# Patient Record
Sex: Female | Born: 1960 | Race: Black or African American | Hispanic: No | Marital: Married | Smoking: Current some day smoker
Health system: Southern US, Community
[De-identification: ages and names within clinical notes are randomized; demographics above are authoritative.]

## PROBLEM LIST (undated history)

## (undated) ENCOUNTER — Ambulatory Visit: Payer: BLUE CROSS/BLUE SHIELD | Source: Home / Self Care

## (undated) DIAGNOSIS — K219 Gastro-esophageal reflux disease without esophagitis: Secondary | ICD-10-CM

## (undated) DIAGNOSIS — F1910 Other psychoactive substance abuse, uncomplicated: Secondary | ICD-10-CM

## (undated) DIAGNOSIS — F419 Anxiety disorder, unspecified: Secondary | ICD-10-CM

## (undated) DIAGNOSIS — F32A Depression, unspecified: Secondary | ICD-10-CM

## (undated) DIAGNOSIS — D649 Anemia, unspecified: Secondary | ICD-10-CM

## (undated) DIAGNOSIS — G4700 Insomnia, unspecified: Secondary | ICD-10-CM

## (undated) DIAGNOSIS — I10 Essential (primary) hypertension: Secondary | ICD-10-CM

## (undated) DIAGNOSIS — F329 Major depressive disorder, single episode, unspecified: Secondary | ICD-10-CM

## (undated) DIAGNOSIS — E785 Hyperlipidemia, unspecified: Secondary | ICD-10-CM

## (undated) HISTORY — DX: Anemia, unspecified: D64.9

## (undated) HISTORY — DX: Essential (primary) hypertension: I10

## (undated) HISTORY — DX: Other psychoactive substance abuse, uncomplicated: F19.10

## (undated) HISTORY — DX: Hyperlipidemia, unspecified: E78.5

## (undated) HISTORY — DX: Gastro-esophageal reflux disease without esophagitis: K21.9

## (undated) HISTORY — DX: Insomnia, unspecified: G47.00

## (undated) HISTORY — DX: Major depressive disorder, single episode, unspecified: F32.9

## (undated) HISTORY — PX: TUBAL LIGATION: SHX77

## (undated) HISTORY — DX: Anxiety disorder, unspecified: F41.9

## (undated) HISTORY — DX: Depression, unspecified: F32.A

---

## 2010-12-23 HISTORY — PX: COLONOSCOPY: SHX174

## 2011-08-09 ENCOUNTER — Encounter: Payer: Self-pay | Admitting: Gastroenterology

## 2011-08-16 ENCOUNTER — Ambulatory Visit (AMBULATORY_SURGERY_CENTER): Payer: Managed Care, Other (non HMO) | Admitting: *Deleted

## 2011-08-16 ENCOUNTER — Encounter: Payer: Self-pay | Admitting: Gastroenterology

## 2011-08-16 VITALS — Ht 65.0 in | Wt 156.0 lb

## 2011-08-16 DIAGNOSIS — Z1211 Encounter for screening for malignant neoplasm of colon: Secondary | ICD-10-CM

## 2011-08-16 MED ORDER — SUPREP BOWEL PREP KIT 17.5-3.13-1.6 GM/177ML PO SOLN: 1.0000 | ORAL | Status: DC

## 2011-08-23 ENCOUNTER — Other Ambulatory Visit: Payer: Self-pay | Admitting: Gastroenterology

## 2011-09-10 ENCOUNTER — Encounter: Payer: Self-pay | Admitting: Gastroenterology

## 2011-09-10 ENCOUNTER — Ambulatory Visit (AMBULATORY_SURGERY_CENTER): Payer: Managed Care, Other (non HMO) | Admitting: Gastroenterology

## 2011-09-10 VITALS — BP 133/76 | HR 71 | Temp 97.7°F | Resp 22 | Ht 65.0 in | Wt 156.0 lb

## 2011-09-10 DIAGNOSIS — R109 Unspecified abdominal pain: Secondary | ICD-10-CM

## 2011-09-10 DIAGNOSIS — Z1211 Encounter for screening for malignant neoplasm of colon: Secondary | ICD-10-CM

## 2011-09-10 DIAGNOSIS — D126 Benign neoplasm of colon, unspecified: Secondary | ICD-10-CM

## 2011-09-10 DIAGNOSIS — D509 Iron deficiency anemia, unspecified: Secondary | ICD-10-CM

## 2011-09-10 MED ORDER — SODIUM CHLORIDE 0.9 % IV SOLN: 500.0000 mL | INTRAVENOUS | Status: DC

## 2011-09-10 NOTE — Progress Notes (Signed)
Pt tolerated the colon exam very well. Maw  Pt was sedate with propofol bu Brennan Bailey, CRNA.

## 2011-09-11 ENCOUNTER — Telehealth: Payer: Self-pay | Admitting: *Deleted

## 2011-09-11 NOTE — Telephone Encounter (Signed)
No answer, no identifier, no message left.  

## 2011-09-16 ENCOUNTER — Encounter: Payer: Self-pay | Admitting: Gastroenterology

## 2012-04-06 ENCOUNTER — Ambulatory Visit: Payer: Managed Care, Other (non HMO) | Admitting: Gynecology

## 2012-04-09 ENCOUNTER — Encounter: Payer: Self-pay | Admitting: Gynecology

## 2012-04-09 ENCOUNTER — Ambulatory Visit (INDEPENDENT_AMBULATORY_CARE_PROVIDER_SITE_OTHER): Payer: Managed Care, Other (non HMO) | Admitting: Gynecology

## 2012-04-09 ENCOUNTER — Ambulatory Visit: Payer: Managed Care, Other (non HMO) | Admitting: Gynecology

## 2012-04-09 VITALS — BP 114/74 | Ht 65.0 in | Wt 157.0 lb

## 2012-04-09 DIAGNOSIS — N644 Mastodynia: Secondary | ICD-10-CM

## 2012-04-09 DIAGNOSIS — G4700 Insomnia, unspecified: Secondary | ICD-10-CM | POA: Insufficient documentation

## 2012-04-09 DIAGNOSIS — D649 Anemia, unspecified: Secondary | ICD-10-CM

## 2012-04-09 DIAGNOSIS — F1010 Alcohol abuse, uncomplicated: Secondary | ICD-10-CM

## 2012-04-09 DIAGNOSIS — N939 Abnormal uterine and vaginal bleeding, unspecified: Secondary | ICD-10-CM

## 2012-04-09 DIAGNOSIS — F17200 Nicotine dependence, unspecified, uncomplicated: Secondary | ICD-10-CM | POA: Insufficient documentation

## 2012-04-09 DIAGNOSIS — N938 Other specified abnormal uterine and vaginal bleeding: Secondary | ICD-10-CM | POA: Insufficient documentation

## 2012-04-09 DIAGNOSIS — N924 Excessive bleeding in the premenopausal period: Secondary | ICD-10-CM

## 2012-04-09 DIAGNOSIS — I10 Essential (primary) hypertension: Secondary | ICD-10-CM | POA: Insufficient documentation

## 2012-04-09 MED ORDER — IRON 325 (65 FE) MG PO TABS: 325.0000 mg | ORAL_TABLET | Freq: Two times a day (BID) | ORAL | Status: DC

## 2012-04-09 MED ORDER — MEGESTROL ACETATE 40 MG PO TABS: 40.0000 mg | ORAL_TABLET | Freq: Two times a day (BID) | ORAL | Status: AC

## 2012-04-09 NOTE — Patient Instructions (Signed)
Transvaginal Ultrasound Transvaginal ultrasound is a pelvic ultrasound, using a metal probe that is placed in the vagina, to look at a women's female organs. Transvaginal ultrasound is a method of seeing inside the pelvis of a woman. The ultrasound machine sends out sound waves from the transducer (probe). These sound waves bounce off body structures (like an echo) to create a picture. The picture shows up on a monitor. It is called transvaginal because the probe is inserted into the vagina. There should be very little discomfort from the vaginal probe. This test can also be used during pregnancy. Endovaginal ultrasound is another name for a transvaginal ultrasound. In a transabdominal ultrasound, the probe is placed on the outside of the belly. This method gives pictures that are lower quality than pictures from the transvaginal technique. Transvaginal ultrasound is used to look for problems of the female genital tract. Some such problems include:  Infertility problems.   Congenital (birth defect) malformations of the uterus and ovaries.   Tumors in the uterus.   Abnormal bleeding.   Ovarian tumors and cysts.   Abscess (inflamed tissue around pus) in the pelvis.   Unexplained abdominal or pelvic pain.   Pelvic infection.  DURING PREGNANCY, TRANSVAGINAL ULTRASOUND MAY BE USED TO LOOK AT:  Normal pregnancy.   Ectopic pregnancy (pregnancy outside the uterus).   Fetal heartbeat.   Abnormalities in the pelvis, that are not seen well with transabdominal ultrasound.   Suspected twins or multiples.   Impending miscarriage.   Problems with the cervix (incompetent cervix, not able to stay closed and hold the baby).   When doing an amniocentesis (removing fluid from the pregnancy sac, for testing).   Looking for abnormalities of the baby.   Checking the growth, development, and age of the fetus.   Measuring the amount of fluid in the amniotic sac.   When doing an external version of  the baby (moving baby into correct position).   Evaluating the baby for problems in high risk pregnancies (biophysical profile).   Suspected fetal demise (death).  Sometimes a special ultrasound method called Saline Infusion Sonography (SIS) is used for a more accurate look at the uterus. Sterile saline (salt water) is injected into the uterus of non-pregnant patients to see the inside of the uterus better. SIS is not used on pregnant women. The vaginal probe can also assist in obtaining biopsies of abnormal areas, in draining fluid from cysts on the ovary, and in finding IUDs (intrauterine device, birth control) that cannot be located. PREPARATION FOR TEST A transvaginal ultrasound is done with the bladder empty. The transabdominal ultrasound is done with your bladder full. You may be asked to drink several glasses of water before that exam. Sometimes, a transabdominal ultrasound is done just after a transvaginal ultrasound, to look at organs in your abdomen. PROCEDURE  You will lie down on a table, with your knees bent and your feet in foot holders. The probe is covered with a condom. A sterile lubricant is put into the vagina and on the probe. The lubricant helps transmit the sound waves and avoid irritating the vagina. Your caregiver will move the probe inside the vaginal cavity to scan the pelvic structures. A normal test will show a normal pelvis and normal contents. An abnormal test will show abnormalities of the pelvis, placenta, or baby. ABNORMAL RESULTS MAY BE DUE TO:  Growths or tumors in the:   Uterus.   Ovaries.   Vagina.   Other pelvic structures.   Non-cancerous   growths of the uterus and ovaries.   Twisting of the ovary, cutting off blood supply to the ovary (ovarian torsion).   Areas of infection, including:   Pelvic inflammatory disease.   Abscess in the pelvis.   Locating an IUD.  PROBLEMS FOUND IN PREGNANT WOMEN MAY INCLUDE:  Ectopic pregnancy (pregnancy outside  the uterus).   Multiple pregnancies.   Early dilation (opening) of the cervix. This may indicate an incompetent cervix and early delivery.   Impending miscarriage.   Fetal death.   Problems with the placenta, including:   Placenta has grown over the opening of the womb (placenta previa).   Placenta has separated early in the womb (placental abruption).   Placenta grows into the muscle of the uterus (placenta accreta).   Tumors of pregnancy, including gestational trophoblastic disease. This is an abnormal pregnancy, with no fetus. The uterus is filled with many grape-like cysts that could sometimes be cancerous.   Incorrect position of the fetus (breech, vertex).   Intrauterine fetal growth retardation (IUGR) (poor growth in the womb).   Fetal abnormalities or infection.  RISKS AND COMPLICATIONS There are no known risks to the ultrasound procedure. There is no X-ray used when doing an ultrasound. Document Released: 11/20/2004 Document Revised: 11/28/2011 Document Reviewed: 11/08/2009 [facility] Patient Information 2012 [place], [place].  Smoking Cessation This document explains the best ways for you to quit smoking and new treatments to help. It lists new medicines that can double or triple your chances of quitting and quitting for good. It also considers ways to avoid relapses and concerns you may have about quitting, including weight gain. NICOTINE: A POWERFUL ADDICTION If you have tried to quit smoking, you know how hard it can be. It is hard because nicotine is a very addictive drug. For some people, it can be as addictive as heroin or cocaine. Usually, people make 2 or 3 tries, or more, before finally being able to quit. Each time you try to quit, you can learn about what helps and what hurts. Quitting takes hard work and a lot of effort, but you can quit smoking. QUITTING SMOKING IS ONE OF THE MOST IMPORTANT THINGS YOU WILL EVER DO.  You will live longer, feel better, and live  better.   The impact on your body of quitting smoking is felt almost immediately:   Within 20 minutes, blood pressure decreases. Pulse returns to its normal level.   After 8 hours, carbon monoxide levels in the blood return to normal. Oxygen level increases.   After 24 hours, chance of heart attack starts to decrease. Breath, hair, and body stop smelling like smoke.   After 48 hours, damaged nerve endings begin to recover. Sense of taste and smell improve.   After 72 hours, the body is virtually free of nicotine. Bronchial tubes relax and breathing becomes easier.   After 2 to 12 weeks, lungs can hold more air. Exercise becomes easier and circulation improves.   Quitting will reduce your risk of having a heart attack, stroke, cancer, or lung disease:   After 1 year, the risk of coronary heart disease is cut in half.   After 5 years, the risk of stroke falls to the same as a nonsmoker.   After 10 years, the risk of lung cancer is cut in half and the risk of other cancers decreases significantly.   After 15 years, the risk of coronary heart disease drops, usually to the level of a nonsmoker.   If you are pregnant,  quitting smoking will improve your chances of having a healthy baby.   The people you live with, especially your children, will be healthier.   You will have extra money to spend on things other than cigarettes.  FIVE KEYS TO QUITTING Studies have shown that these 5 steps will help you quit smoking and quit for good. You have the best chances of quitting if you use them together: 1. Get ready.  2. Get support and encouragement.  3. Learn new skills and behaviors.  4. Get medicine to reduce your nicotine addiction and use it correctly.  5. Be prepared for relapse or difficult situations. Be determined to continue trying to quit, even if you do not succeed at first.  1. GET READY  Set a quit date.   Change your environment.   Get rid of ALL cigarettes, ashtrays,  matches, and lighters in your home, car, and place of work.   Do not let people smoke in your home.   Review your past attempts to quit. Think about what worked and what did not.   Once you quit, do not smoke. NOT EVEN A PUFF!  2. GET SUPPORT AND ENCOURAGEMENT Studies have shown that you have a better chance of being successful if you have help. You can get support in many ways.  Tell your family, friends, and coworkers that you are going to quit and need their support. Ask them not to smoke around you.   Talk to your caregivers (doctor, dentist, nurse, pharmacist, psychologist, and/or smoking counselor).   Get individual, group, or telephone counseling and support. The more counseling you have, the better your chances are of quitting. Programs are available at [employer] and health centers. Call your local health department for information about programs in your area.   Spiritual beliefs and practices may help some smokers quit.   Quit meters are Photographer that keep track of quit statistics, such as amount of "quit-time," cigarettes not smoked, and money saved.   Many smokers find one or more of the many self-help books available useful in helping them quit and stay off tobacco.  3. LEARN NEW SKILLS AND BEHAVIORS  Try to distract yourself from urges to smoke. Talk to someone, go for a walk, or occupy your time with a task.   When you first try to quit, change your routine. Take a different route to work. Drink tea instead of coffee. Eat breakfast in a different place.   Do something to reduce your stress. Take a hot bath, exercise, or read a book.   Plan something enjoyable to do every day. Reward yourself for not smoking.   Explore interactive web-based programs that specialize in helping you quit.  4. GET MEDICINE AND USE IT CORRECTLY Medicines can help you stop smoking and decrease the urge to smoke. Combining medicine with the above  behavioral methods and support can quadruple your chances of successfully quitting smoking. The U.S. Food and Drug Administration (FDA) has approved 7 medicines to help you quit smoking. These medicines fall into 3 categories.  Nicotine replacement therapy (delivers nicotine to your body without the negative effects and risks of smoking):   Nicotine gum: Available over-the-counter.   Nicotine lozenges: Available over-the-counter.   Nicotine inhaler: Available by prescription.   Nicotine nasal spray: Available by prescription.   Nicotine skin patches (transdermal): Available by prescription and over-the-counter.   Antidepressant medicine (helps people abstain from smoking, but how this works is unknown):  Bupropion sustained-release (SR) tablets: Available by prescription.   Nicotinic receptor partial agonist (simulates the effect of nicotine in your brain):   Varenicline tartrate tablets: Available by prescription.   Ask your caregiver for advice about which medicines to use and how to use them. Carefully read the information on the package.   Everyone who is trying to quit may benefit from using a medicine. If you are pregnant or trying to become pregnant, nursing an infant, you are under age 75, or you smoke fewer than 10 cigarettes per day, talk to your caregiver before taking any nicotine replacement medicines.   You should stop using a nicotine replacement product and call your caregiver if you experience nausea, dizziness, weakness, vomiting, fast or irregular heartbeat, mouth problems with the lozenge or gum, or redness or swelling of the skin around the patch that does not go away.   Do not use any other product containing nicotine while using a nicotine replacement product.   Talk to your caregiver before using these products if you have diabetes, heart disease, asthma, stomach ulcers, you had a recent heart attack, you have high blood pressure that is not controlled with  medicine, a history of irregular heartbeat, or you have been prescribed medicine to help you quit smoking.  5. BE PREPARED FOR RELAPSE OR DIFFICULT SITUATIONS  Most relapses occur within the first 3 months after quitting. Do not be discouraged if you start smoking again. Remember, most people try several times before they finally quit.   You may have symptoms of withdrawal because your body is used to nicotine. You may crave cigarettes, be irritable, feel very hungry, cough often, get headaches, or have difficulty concentrating.   The withdrawal symptoms are only temporary. They are strongest when you first quit, but they will go away within 10 to 14 days.  Here are some difficult situations to watch for:  Alcohol. Avoid drinking alcohol. Drinking lowers your chances of successfully quitting.   Caffeine. Try to reduce the amount of caffeine you consume. It also lowers your chances of successfully quitting.   Other smokers. Being around smoking can make you want to smoke. Avoid smokers.   Weight gain. Many smokers will gain weight when they quit, usually less than 10 pounds. Eat a healthy diet and stay active. Do not let weight gain distract you from your main goal, quitting smoking. Some medicines that help you quit smoking may also help delay weight gain. You can always lose the weight gained after you quit.   Bad mood or depression. There are a lot of ways to improve your mood other than smoking.  If you are having problems with any of these situations, talk to your caregiver. SPECIAL SITUATIONS AND CONDITIONS Studies suggest that everyone can quit smoking. Your situation or condition can give you a special reason to quit.  Pregnant women/new mothers: By quitting, you protect your baby's health and your own.   Hospitalized patients: By quitting, you reduce health problems and help healing.   Heart attack patients: By quitting, you reduce your risk of a second heart attack.   Lung,  head, and neck cancer patients: By quitting, you reduce your chance of a second cancer.   Parents of children and adolescents: By quitting, you protect your children from illnesses caused by secondhand smoke.  QUESTIONS TO THINK ABOUT Think about the following questions before you try to stop smoking. You may want to talk about your answers with your caregiver.  Why do you  want to quit?   If you tried to quit in the past, what helped and what did not?   What will be the most difficult situations for you after you quit? How will you plan to handle them?   Who can help you through the tough times? Your family? Friends? Caregiver?   What pleasures do you get from smoking? What ways can you still get pleasure if you quit?  Here are some questions to ask your caregiver:  How can you help me to be successful at quitting?   What medicine do you think would be best for me and how should I take it?   What should I do if I need more help?   What is smoking withdrawal like? How can I get information on withdrawal?  Quitting takes hard work and a lot of effort, but you can quit smoking. FOR MORE INFORMATION  Smokefree.gov (http://www.davis-sullivan.com/) provides free, accurate, evidence-based information and professional assistance to help support the immediate and long-term needs of people trying to quit smoking. Document Released: 12/03/2001 Document Revised: 11/28/2011 Document Reviewed: 09/25/2009 [facility] Patient Information 2012 [place], [place].  Finding Treatment for Alcohol and Drug Addiction It can be hard to find the right place to get professional treatment. Here are some important things to consider:  There are different types of treatment to choose from.   Some programs are live-in (residential) while others are not (outpatient). Sometimes a combination is offered.   No single type of program is right for everyone.   Most treatment programs involve a combination of education,  counseling, and a 12-step, spiritually-based approach.   There are non-spiritually based programs (not 12-step).   Some treatment programs are government sponsored. They are geared for patients without private insurance.   Treatment programs can vary in many respects such as:   Cost and types of insurance accepted.   Types of on-site medical services offered.   Length of stay, setting, and size.   Overall philosophy of treatment.  A person may need specialized treatment or have needs not addressed by all programs. For example, adolescents need treatment appropriate for their age. Other people have secondary disorders that must be managed as well. Secondary conditions can include mental illness, such as depression or diabetes. Often, a period of detoxification from alcohol or drugs is needed. This requires medical supervision and not all programs offer this. THINGS TO CONSIDER WHEN SELECTING A TREATMENT PROGRAM   Is the program certified by the appropriate government agency? Even private programs must be certified and employ certified professionals.   Does the program accept your insurance? If not, can a payment plan be set up?   Is the facility clean, organized, and well run? Do they allow you to speak with graduates who can share their treatment experience with you? Can you tour the facility? Can you meet with staff?   Does the program meet the full range of individual needs?   Does the treatment program address sexual orientation and physical disabilities? Do they provide age, gender, and culturally appropriate treatment services?   Is treatment available in languages other than English?   Is long-term aftercare support or guidance encouraged and provided?   Is assessment of an individual's treatment plan ongoing to ensure it meets changing needs?   Does the program use strategies to encourage reluctant patients to remain in treatment long enough to increase the likelihood of  success?   Does the program offer counseling (individual or group) and other behavioral  therapies?   Does the program offer medicine as part of the treatment regimen, if needed?   Is there ongoing monitoring of possible relapse? Is there a defined relapse prevention program? Are services or referrals offered to family members to ensure they understand addiction and the recovery process? This would help them support the recovering individual.   Are 12-step meetings held at the center or is transport available for patients to attend outside meetings?  In countries outside of the [place]. and [place], Magazine features editor for contact information for services in your area. Document Released: 11/07/2005 Document Revised: 11/28/2011 Document Reviewed: 05/19/2008 [facility] Patient Information 2012 [place], [place].

## 2012-04-09 NOTE — Progress Notes (Signed)
Patient is a 51 year old who was referred to our practice as a courtesy from Windy Carina PA-C. as a result of patient's severe menorrhagia and anemia. Patient states that she's had irregular cycles for the past 8 months. She has a history of a prior tubal sterilization procedure. Her hemoglobin recently has been as low as 8.9. She is currently on iron supplementation 1 tablet twice a day. She was recently treated for urinary tract infection. In an effort to contain her menorrhagia she had received Provera on 2 occasions lasting for 10 days which temporarily resolved her symptoms. She's currently not bleeding today. Review of medical records notes indicated that she had a negative GC and Chlamydia culture in 2012 as well as her TSH. She also had a normal Pap smear in July of 2012. Patient has a history of alcohol abuse as is a chronic smoker. Her primary provider has been treating her as well for her hypertension. She was having some mild bilateral breast tenderness recently. She did state that she had a normal mammogram i a few months ago which was negative but they want her to followup in 6 months. (And a half copy of those report)  Exam: Abdomen: Soft nontender no rebound or guarding Pelvic: Bartholin urethra Skene glands Vagina: No lesions or discharge Cervix: No lesions or discharge Uterus: Anteverted upper limits of normal Adnexa: No palpable masses or tenderness Rectal exam: Not done  As part of her evaluation for dysfunctional uterine bleeding patient was counseled to undergo an endometrial biopsy today in the office. The cervix was cleansed with Betadine solution and a sterile Pipelle was introduced into the intrauterine cavity (uterus sounded to 7-1/2 cm) and moderate amount of tissue was obtained and was submitted for histological evaluation.  Assessment/plan: Perimenopausal patient with dysfunctional uterine bleeding and chronic anemia (microcytic hypochromic/normal hemoglobin  electrophoresis/normal platelet count/low iron and high TIBC and low transferrin). Endometrial biopsy done today to rule out endometrial hyperplasia or endometrial carcinoma. Patient will return back to the office next week for sonohysterogram to complete evaluation and to rule out submucous myoma or endometrial polyps contributing to her irregular bleeding. Will sit down to discuss treatment options after evaluation is completed. An FSH was drawn today as well. Information on alcohol abuse and support groups in the community was provided as well as smoking cessation.

## 2012-04-17 ENCOUNTER — Ambulatory Visit: Payer: Managed Care, Other (non HMO) | Admitting: Gynecology

## 2012-04-17 ENCOUNTER — Other Ambulatory Visit: Payer: Managed Care, Other (non HMO)

## 2012-05-08 ENCOUNTER — Other Ambulatory Visit: Payer: Self-pay | Admitting: Physician Assistant

## 2012-05-11 ENCOUNTER — Telehealth: Payer: Self-pay | Admitting: *Deleted

## 2012-05-11 NOTE — Telephone Encounter (Signed)
Pt called c/o vaginal bleeding, pt has [facility] appointment on 04/17/12 and canceled. She was giving megace 40 mg twice daily to help with bleeding on 04/09/12 OV with 1 refill. Pt asking to reschedule [facility] appointment transferred to appointment desk. Pt instructed to refill megace to control bleeding.

## 2012-06-02 ENCOUNTER — Telehealth: Payer: Self-pay | Admitting: *Deleted

## 2012-06-02 MED ORDER — MEGESTROL ACETATE 40 MG PO TABS: 40.0000 mg | ORAL_TABLET | Freq: Two times a day (BID) | ORAL | Status: AC

## 2012-06-02 NOTE — Telephone Encounter (Signed)
Pt is calling requesting for refill on megace 40 mg tablet, c/o vaginal bleeding that started last week spotting then on Saturday bleeding became heavy. Pt is wearing pads and tampons now. Spoke with JF and was told to tell pt to continue taking iron tablets as directed and may have megace 40 mg twice daily x 5 days with 1 refill. Pt will be informed with all the above

## 2012-07-03 ENCOUNTER — Ambulatory Visit (INDEPENDENT_AMBULATORY_CARE_PROVIDER_SITE_OTHER): Payer: Managed Care, Other (non HMO)

## 2012-07-03 ENCOUNTER — Other Ambulatory Visit: Payer: Self-pay | Admitting: Gynecology

## 2012-07-03 ENCOUNTER — Encounter: Payer: Self-pay | Admitting: Gynecology

## 2012-07-03 ENCOUNTER — Ambulatory Visit (INDEPENDENT_AMBULATORY_CARE_PROVIDER_SITE_OTHER): Payer: Managed Care, Other (non HMO) | Admitting: Gynecology

## 2012-07-03 DIAGNOSIS — N949 Unspecified condition associated with female genital organs and menstrual cycle: Secondary | ICD-10-CM

## 2012-07-03 DIAGNOSIS — N938 Other specified abnormal uterine and vaginal bleeding: Secondary | ICD-10-CM

## 2012-07-03 DIAGNOSIS — N920 Excessive and frequent menstruation with regular cycle: Secondary | ICD-10-CM

## 2012-07-03 NOTE — Progress Notes (Signed)
Patient is a 51 year old who was seen in the office on April 18 as a result of patient's severe menorrhagia and anemia. Patient states that she's had irregular cycles for the past 8 months. She has a history of a prior tubal sterilization procedure. Her hemoglobin recently has been as low as 8.9. She is currently on iron supplementation 1 tablet twice a day. She was recently treated for urinary tract infection. In an effort to contain her menorrhagia she had received Provera on 2 occasions lasting for 10 days which temporarily resolved her symptoms. She's currently not bleeding today. Review of medical records notes indicated that she had a negative GC and Chlamydia culture in 2012 as well as her TSH. She also had a normal Pap smear in July of 2012. Patient has a history of alcohol abuse as is a chronic smoker. Her primary provider has been treating her as well for her hypertension.  Patient is here in the office today for sonohysterogram. Results as follows: Uterus measured 9.3 x 6.1 x 5.2 cm with endometrial stripe of 4.1 mm. She has 2 small fibroids one measured 10 x 8 mm, the second one measured 11 x 11 mm, ovaries appear to be normal. Sonohysterogram no intracavitary defect.  Patient had an endometrial biopsy on April 18 with the following result:  Diagnosis Endometrium, biopsy - EARLY SECRETORY PATTERN ENDOMETRIUM. - NO HYPERPLASIA, ATYPIA OR MALIGNANCY IDENTIFIED.   Assessment/plan: Patient was menorrhagia causing her to be anemic. We discussed different treatment options such as #1 endometrial ablation #2 Mirena IUD or #3 total laparoscopic hysterectomy with bilateral subbing oophorectomy. Patient stated she would like to proceed with a Mirena IUD and she will return back next week to have one  placed. Literature information was provided and the risks benefits and pros and cons were discussed. If she were to continue to bleed despite a Mirena IUD then the  ultimate procedure would be to proceed  with hysterectomy. She is to continue taking her iron supplementation as well. All questions were answered and we'll follow accordingly.

## 2012-07-03 NOTE — Patient Instructions (Addendum)
Intrauterine Device Information An intrauterine device (IUD) is inserted into your uterus and prevents pregnancy. There are 2 types of IUDs available:  Copper IUD. This type of IUD is wrapped in copper wire and is placed inside the uterus. Copper makes the uterus and fallopian tubes produce a fluid that kills sperm. The copper IUD can stay in place for 10 years.   Hormone IUD. This type of IUD contains the hormone progestin (synthetic progesterone). The hormone thickens the cervical mucus and prevents sperm from entering the uterus, and it also thins the uterine lining to prevent implantation of a fertilized egg. The hormone can weaken or kill the sperm that get into the uterus. The hormone IUD can stay in place for 5 years.  Your caregiver will make sure you are a good candidate for a contraceptive IUD. Discuss with your caregiver the possible side effects. ADVANTAGES  It is highly effective, reversible, long-acting, and low maintenance.   There are no estrogen-related side effects.   An IUD can be used when breastfeeding.   It is not associated with weight gain.   It works immediately after insertion.   The copper IUD does not interfere with your female hormones.   The progesterone IUD can make heavy menstrual periods lighter.   The progesterone IUD can be used for 5 years.   The copper IUD can be used for 10 years.  DISADVANTAGES  The progesterone IUD can be associated with irregular bleeding patterns.   The copper IUD can make your menstrual flow heavier and more painful.   You may experience cramping and vaginal bleeding after insertion.  Document Released: 11/12/2004 Document Revised: 11/28/2011 Document Reviewed: 04/13/2011 [employer] Patient Information 2012 [employer]. 

## 2012-07-06 ENCOUNTER — Other Ambulatory Visit (HOSPITAL_COMMUNITY)
Admission: RE | Admit: 2012-07-06 | Discharge: 2012-07-06 | Disposition: A | Discharge status: Home / Self Care | Payer: Managed Care, Other (non HMO) | Source: Ambulatory Visit | Attending: Gynecology | Admitting: Gynecology

## 2012-07-06 ENCOUNTER — Encounter: Payer: Self-pay | Admitting: Gynecology

## 2012-07-06 ENCOUNTER — Ambulatory Visit (INDEPENDENT_AMBULATORY_CARE_PROVIDER_SITE_OTHER): Payer: Managed Care, Other (non HMO) | Admitting: Gynecology

## 2012-07-06 ENCOUNTER — Other Ambulatory Visit: Payer: Self-pay | Admitting: Gynecology

## 2012-07-06 VITALS — BP 126/82

## 2012-07-06 DIAGNOSIS — Z30430 Encounter for insertion of intrauterine contraceptive device: Secondary | ICD-10-CM

## 2012-07-06 DIAGNOSIS — N920 Excessive and frequent menstruation with regular cycle: Secondary | ICD-10-CM

## 2012-07-06 DIAGNOSIS — Z124 Encounter for screening for malignant neoplasm of cervix: Secondary | ICD-10-CM

## 2012-07-06 DIAGNOSIS — Z01419 Encounter for gynecological examination (general) (routine) without abnormal findings: Secondary | ICD-10-CM | POA: Insufficient documentation

## 2012-07-06 DIAGNOSIS — Z1151 Encounter for screening for human papillomavirus (HPV): Secondary | ICD-10-CM | POA: Insufficient documentation

## 2012-07-06 NOTE — Progress Notes (Signed)
Is a 51 year old who presented to the office today for placement of Mirena IUD. Patient was last seen the office on July 12 and prior to that on April 18. Patient history of severe menorrhagia and anemia. Her cycles have been regular for prior 8 months. Patient with prior tubal sterilization procedure. Her most recent hemoglobin was described with a value of 8.9. She is currently on iron supplementation.  On that last office visit patient underwent a sonohysterogram and endometrial biopsy with the following:  Uterus measured 9.3 x 6.1 x 5.2 cm with endometrial stripe of 4.1 mm. She has 2 small fibroids one measured 10 x 8 mm, the second one measured 11 x 11 mm, ovaries appear to be normal. Sonohysterogram no intracavitary defect.  Patient had an endometrial biopsy on April 18 with the following result:  Diagnosis  Endometrium, biopsy  - EARLY SECRETORY PATTERN ENDOMETRIUM.  - NO HYPERPLASIA, ATYPIA OR MALIGNANCY IDENTIFIED.   Patient previously written information on the Mirena IUD. The risks benefits and pros and cons were discussed. Consent form was signed. Patient fully where this form of contraception is good for 5 years is 99% effective.  Procedure note: Patient first underwent a Pap smear since she was overdue. Her last Pap smear was in August of 2012 and [place] her with the results been normal which was reviewed. We will start followed new screening guidelines after today's Pap smear.  Pelvic exam: Bartholin urethra Skene was within normal limits Vagina: Dark brown blood present in the vaginal vault Cervix: No lesions or discharge Uterus: Anteverted normal size shape and consistency Adnexa no palpable masses or tenderness Rectal exam: Not done  The cervix was cleansed with Betadine solution and a single-tooth tenaculum was placed on the anterior cervical lip. The uterus sounded to 8 cm. The Mirena IUD was shown to the patient and then inserted in a sterile fashion and the string was  cut. The single-tooth tenaculum was removed there was no bleeding and no complications. Patient tolerated procedure well. Patient will return back to the office in one month for followup.

## 2012-07-06 NOTE — Addendum Note (Signed)
Addended by: Bertram Savin A on: 07/06/2012 09:41 AM   Modules accepted: Orders

## 2012-07-06 NOTE — Patient Instructions (Addendum)
Intrauterine Device Information An intrauterine device (IUD) is inserted into your uterus and prevents pregnancy. There are 2 types of IUDs available:  Copper IUD. This type of IUD is wrapped in copper wire and is placed inside the uterus. Copper makes the uterus and fallopian tubes produce a fluid that kills sperm. The copper IUD can stay in place for 10 years.   Hormone IUD. This type of IUD contains the hormone progestin (synthetic progesterone). The hormone thickens the cervical mucus and prevents sperm from entering the uterus, and it also thins the uterine lining to prevent implantation of a fertilized egg. The hormone can weaken or kill the sperm that get into the uterus. The hormone IUD can stay in place for 5 years.  Your caregiver will make sure you are a good candidate for a contraceptive IUD. Discuss with your caregiver the possible side effects. ADVANTAGES  It is highly effective, reversible, long-acting, and low maintenance.   There are no estrogen-related side effects.   An IUD can be used when breastfeeding.   It is not associated with weight gain.   It works immediately after insertion.   The copper IUD does not interfere with your female hormones.   The progesterone IUD can make heavy menstrual periods lighter.   The progesterone IUD can be used for 5 years.   The copper IUD can be used for 10 years.  DISADVANTAGES  The progesterone IUD can be associated with irregular bleeding patterns.   The copper IUD can make your menstrual flow heavier and more painful.   You may experience cramping and vaginal bleeding after insertion.  Document Released: 11/12/2004 Document Revised: 11/28/2011 Document Reviewed: 04/13/2011 [employer] Patient Information 2012 [employer]. 

## 2012-08-03 ENCOUNTER — Ambulatory Visit: Payer: Managed Care, Other (non HMO) | Admitting: Gynecology

## 2013-02-02 DIAGNOSIS — I10 Essential (primary) hypertension: Secondary | ICD-10-CM | POA: Insufficient documentation

## 2013-02-02 DIAGNOSIS — F1010 Alcohol abuse, uncomplicated: Secondary | ICD-10-CM | POA: Insufficient documentation

## 2013-03-18 ENCOUNTER — Ambulatory Visit (INDEPENDENT_AMBULATORY_CARE_PROVIDER_SITE_OTHER): Payer: Managed Care, Other (non HMO) | Admitting: Gynecology

## 2013-03-18 ENCOUNTER — Encounter: Payer: Self-pay | Admitting: Gynecology

## 2013-03-18 VITALS — BP 124/74

## 2013-03-18 DIAGNOSIS — Z30431 Encounter for routine checking of intrauterine contraceptive device: Secondary | ICD-10-CM

## 2013-03-18 NOTE — Progress Notes (Signed)
Patient presented to the office today for IUD check. Patient had a Mirena IUD placed on 07/06/2012 and did not followup one month later until now. See previous note for her past history of heavy periods and anemia. Patient is otherwise doing well.  Exam: Bartholin urethra Skene was within normal limits Vagina: No lesions or discharge some menstrual blood was noted Cervix: IUD string was seen and trimmed Uterus: Anteverted normal size shape and consistency Adnexa: No palpable masses or tenderness Rectal exam not done  Assessment/plan: Patient status post Mirena IUD placement for menorrhagia in 2013. Patient is doing well. Patient had an endometrial biopsy with normal secretory pattern noted at that time. Patient also had a normal FSH last year. She scheduled to return to the office in July or August of this year for her annual exam.

## 2013-07-27 ENCOUNTER — Encounter: Payer: Managed Care, Other (non HMO) | Admitting: Gynecology

## 2014-01-18 DIAGNOSIS — E559 Vitamin D deficiency, unspecified: Secondary | ICD-10-CM | POA: Insufficient documentation

## 2014-01-18 DIAGNOSIS — E7800 Pure hypercholesterolemia, unspecified: Secondary | ICD-10-CM | POA: Insufficient documentation

## 2014-02-04 DIAGNOSIS — R894 Abnormal immunological findings in specimens from other organs, systems and tissues: Secondary | ICD-10-CM | POA: Insufficient documentation

## 2014-10-24 ENCOUNTER — Encounter: Payer: Self-pay | Admitting: Gynecology

## 2015-12-29 ENCOUNTER — Encounter (HOSPITAL_COMMUNITY): Payer: Self-pay

## 2015-12-29 ENCOUNTER — Emergency Department (HOSPITAL_COMMUNITY)
Admission: EM | Admit: 2015-12-29 | Discharge: 2015-12-29 | Disposition: A | Discharge status: Home / Self Care | Payer: BLUE CROSS/BLUE SHIELD | Attending: Emergency Medicine | Admitting: Emergency Medicine

## 2015-12-29 DIAGNOSIS — X58XXXA Exposure to other specified factors, initial encounter: Secondary | ICD-10-CM | POA: Insufficient documentation

## 2015-12-29 DIAGNOSIS — I10 Essential (primary) hypertension: Secondary | ICD-10-CM | POA: Insufficient documentation

## 2015-12-29 DIAGNOSIS — Z79899 Other long term (current) drug therapy: Secondary | ICD-10-CM | POA: Insufficient documentation

## 2015-12-29 DIAGNOSIS — F17210 Nicotine dependence, cigarettes, uncomplicated: Secondary | ICD-10-CM | POA: Diagnosis not present

## 2015-12-29 DIAGNOSIS — G4700 Insomnia, unspecified: Secondary | ICD-10-CM | POA: Insufficient documentation

## 2015-12-29 DIAGNOSIS — Y998 Other external cause status: Secondary | ICD-10-CM | POA: Insufficient documentation

## 2015-12-29 DIAGNOSIS — Z8659 Personal history of other mental and behavioral disorders: Secondary | ICD-10-CM | POA: Diagnosis not present

## 2015-12-29 DIAGNOSIS — Y9389 Activity, other specified: Secondary | ICD-10-CM | POA: Diagnosis not present

## 2015-12-29 DIAGNOSIS — S39012A Strain of muscle, fascia and tendon of lower back, initial encounter: Secondary | ICD-10-CM

## 2015-12-29 DIAGNOSIS — M545 Low back pain: Secondary | ICD-10-CM | POA: Diagnosis present

## 2015-12-29 DIAGNOSIS — E785 Hyperlipidemia, unspecified: Secondary | ICD-10-CM | POA: Insufficient documentation

## 2015-12-29 DIAGNOSIS — D649 Anemia, unspecified: Secondary | ICD-10-CM | POA: Insufficient documentation

## 2015-12-29 DIAGNOSIS — K219 Gastro-esophageal reflux disease without esophagitis: Secondary | ICD-10-CM | POA: Diagnosis not present

## 2015-12-29 DIAGNOSIS — Y9289 Other specified places as the place of occurrence of the external cause: Secondary | ICD-10-CM | POA: Diagnosis not present

## 2015-12-29 MED ORDER — CYCLOBENZAPRINE HCL 10 MG PO TABS: 10.0000 mg | ORAL_TABLET | Freq: Two times a day (BID) | ORAL | Status: DC | PRN

## 2015-12-29 MED ORDER — NAPROXEN 500 MG PO TABS: 500.0000 mg | ORAL_TABLET | Freq: Two times a day (BID) | ORAL | Status: DC

## 2015-12-29 NOTE — Discharge Instructions (Signed)
Cryotherapy °Cryotherapy means treatment with cold. Ice or gel packs can be used to reduce both pain and swelling. Ice is the most helpful within the first 24 to 48 hours after an injury or flare-up from overusing a muscle or joint. Sprains, strains, spasms, burning pain, shooting pain, and aches can all be eased with ice. Ice can also be used when recovering from surgery. Ice is effective, has very few side effects, and is safe for most people to use. °PRECAUTIONS  °Ice is not a safe treatment option for people with: °· Raynaud phenomenon. This is a condition affecting small blood vessels in the extremities. Exposure to cold may cause your problems to return. °· Cold hypersensitivity. There are many forms of cold hypersensitivity, including: °· Cold urticaria. Red, itchy hives appear on the skin when the tissues begin to warm after being iced. °· Cold erythema. This is a red, itchy rash caused by exposure to cold. °· Cold hemoglobinuria. Red blood cells break down when the tissues begin to warm after being iced. The hemoglobin that carry oxygen are passed into the urine because they cannot combine with blood proteins fast enough. °· Numbness or altered sensitivity in the area being iced. °If you have any of the following conditions, do not use ice until you have discussed cryotherapy with your caregiver: °· Heart conditions, such as arrhythmia, angina, or chronic heart disease. °· High blood pressure. °· Healing wounds or open skin in the area being iced. °· Current infections. °· Rheumatoid arthritis. °· Poor circulation. °· Diabetes. °Ice slows the blood flow in the region it is applied. This is beneficial when trying to stop inflamed tissues from spreading irritating chemicals to surrounding tissues. However, if you expose your skin to cold temperatures for too long or without the proper protection, you can damage your skin or nerves. Watch for signs of skin damage due to cold. °HOME CARE INSTRUCTIONS °Follow  these tips to use ice and cold packs safely. °· Place a dry or damp towel between the ice and skin. A damp towel will cool the skin more quickly, so you may need to shorten the time that the ice is used. °· For a more rapid response, add gentle compression to the ice. °· Ice for no more than 10 to 20 minutes at a time. The bonier the area you are icing, the less time it will take to get the benefits of ice. °· Check your skin after 5 minutes to make sure there are no signs of a poor response to cold or skin damage. °· Rest 20 minutes or more between uses. °· Once your skin is numb, you can end your treatment. You can test numbness by very lightly touching your skin. The touch should be so light that you do not see the skin dimple from the pressure of your fingertip. When using ice, most people will feel these normal sensations in this order: cold, burning, aching, and numbness. °· Do not use ice on someone who cannot communicate their responses to pain, such as small children or people with dementia. °HOW TO MAKE AN ICE PACK °Ice packs are the most common way to use ice therapy. Other methods include ice massage, ice baths, and cryosprays. Muscle creams that cause a cold, tingly feeling do not offer the same benefits that ice offers and should not be used as a substitute unless recommended by your caregiver. °To make an ice pack, do one of the following: °· Place crushed ice or a   bag of frozen vegetables in a sealable plastic bag. Squeeze out the excess air. Place this bag inside another plastic bag. Slide the bag into a pillowcase or place a damp towel between your skin and the bag.  Mix 3 parts water with 1 part rubbing alcohol. Freeze the mixture in a sealable plastic bag. When you remove the mixture from the freezer, it will be slushy. Squeeze out the excess air. Place this bag inside another plastic bag. Slide the bag into a pillowcase or place a damp towel between your skin and the bag. SEEK MEDICAL CARE  IF:  You develop white spots on your skin. This may give the skin a blotchy (mottled) appearance.  Your skin turns blue or pale.  Your skin becomes waxy or hard.  Your swelling gets worse. MAKE SURE YOU:   Understand these instructions.  Will watch your condition.  Will get help right away if you are not doing well or get worse.   This information is not intended to replace advice given to you by your health care provider. Make sure you discuss any questions you have with your health care provider.   Document Released: 08/05/2011 Document Revised: 12/30/2014 Document Reviewed: 08/05/2011 [employer] Interactive Patient Education 2016 [employer] Strain With Rehab A strain is an injury in which a tendon or muscle is torn. The muscles and tendons of the lower back are vulnerable to strains. However, these muscles and tendons are very strong and require a great force to be injured. Strains are classified into three categories. Grade 1 strains cause pain, but the tendon is not lengthened. Grade 2 strains include a lengthened ligament, due to the ligament being stretched or partially ruptured. With grade 2 strains there is still function, although the function may be decreased. Grade 3 strains involve a complete tear of the tendon or muscle, and function is usually impaired. SYMPTOMS   Pain in the lower back.  Pain that affects one side more than the other.  Pain that gets worse with movement and may be felt in the hip, buttocks, or back of the thigh.  Muscle spasms of the muscles in the back.  Swelling along the muscles of the back.  Loss of strength of the back muscles.  Crackling sound (crepitation) when the muscles are touched. CAUSES  Lower back strains occur when a force is placed on the muscles or tendons that is greater than they can handle. Common causes of injury include:  Prolonged overuse of the muscle-tendon units in the lower back, usually from incorrect  posture.  A single violent injury or force applied to the back. RISK INCREASES WITH:  Sports that involve twisting forces on the spine or a lot of bending at the waist (football, rugby, weightlifting, bowling, golf, tennis, speed skating, racquetball, swimming, running, gymnastics, diving).  Poor strength and flexibility.  Failure to warm up properly before activity.  Family history of lower back pain or disk disorders.  Previous back injury or surgery (especially fusion).  Poor posture with lifting, especially heavy objects.  Prolonged sitting, especially with poor posture. PREVENTION   Learn and use proper posture when sitting or lifting (maintain proper posture when sitting, lift using the knees and legs, not at the waist).  Warm up and stretch properly before activity.  Allow for adequate recovery between workouts.  Maintain physical fitness:  Strength, flexibility, and endurance.  Cardiovascular fitness. PROGNOSIS  If treated properly, lower back strains usually heal within 6 weeks. RELATED  COMPLICATIONS   Recurring symptoms, resulting in a chronic problem.  Chronic inflammation, scarring, and partial muscle-tendon tear.  Delayed healing or resolution of symptoms.  Prolonged disability. TREATMENT  Treatment first involves the use of ice and medicine, to reduce pain and inflammation. The use of strengthening and stretching exercises may help reduce pain with activity. These exercises may be performed at home or with a therapist. Severe injuries may require referral to a therapist for further evaluation and treatment, such as ultrasound. Your caregiver may advise that you wear a back brace or corset, to help reduce pain and discomfort. Often, prolonged bed rest results in greater harm then benefit. Corticosteroid injections may be recommended. However, these should be reserved for the most serious cases. It is important to avoid using your back when lifting objects. At  night, sleep on your back on a firm mattress with a pillow placed under your knees. If non-surgical treatment is unsuccessful, surgery may be needed.  MEDICATION   If pain medicine is needed, nonsteroidal anti-inflammatory medicines (aspirin and ibuprofen), or other minor pain relievers (acetaminophen), are often advised.  Do not take pain medicine for 7 days before surgery.  Prescription pain relievers may be given, if your caregiver thinks they are needed. Use only as directed and only as much as you need.  Ointments applied to the skin may be helpful.  Corticosteroid injections may be given by your caregiver. These injections should be reserved for the most serious cases, because they may only be given a certain number of times. HEAT AND COLD  Cold treatment (icing) should be applied for 10 to 15 minutes every 2 to 3 hours for inflammation and pain, and immediately after activity that aggravates your symptoms. Use ice packs or an ice massage.  Heat treatment may be used before performing stretching and strengthening activities prescribed by your caregiver, physical therapist, or athletic trainer. Use a heat pack or a warm water soak. SEEK MEDICAL CARE IF:   Symptoms get worse or do not improve in 2 to 4 weeks, despite treatment.  You develop numbness, weakness, or loss of bowel or bladder function.  New, unexplained symptoms develop. (Drugs used in treatment may produce side effects.) EXERCISES  RANGE OF MOTION (ROM) AND STRETCHING EXERCISES - Low Back Strain Most people with lower back pain will find that their symptoms get worse with excessive bending forward (flexion) or arching at the lower back (extension). The exercises which will help resolve your symptoms will focus on the opposite motion.  Your physician, physical therapist or athletic trainer will help you determine which exercises will be most helpful to resolve your lower back pain. Do not complete any exercises without  first consulting with your caregiver. Discontinue any exercises which make your symptoms worse until you speak to your caregiver.  If you have pain, numbness or tingling which travels down into your buttocks, leg or foot, the goal of the therapy is for these symptoms to move closer to your back and eventually resolve. Sometimes, these leg symptoms will get better, but your lower back pain may worsen. This is typically an indication of progress in your rehabilitation. Be very alert to any changes in your symptoms and the activities in which you participated in the 24 hours prior to the change. Sharing this information with your caregiver will allow him/her to most efficiently treat your condition.  These exercises may help you when beginning to rehabilitate your injury. Your symptoms may resolve with or without further involvement from  your physician, physical therapist or athletic trainer. While completing these exercises, remember:  Restoring tissue flexibility helps normal motion to return to the joints. This allows healthier, less painful movement and activity.  An effective stretch should be held for at least 30 seconds.  A stretch should never be painful. You should only feel a gentle lengthening or release in the stretched tissue. FLEXION RANGE OF MOTION AND STRETCHING EXERCISES: STRETCH - Flexion, Single Knee to Chest   Lie on a firm bed or floor with both legs extended in front of you.  Keeping one leg in contact with the floor, bring your opposite knee to your chest. Hold your leg in place by either grabbing behind your thigh or at your knee.  Pull until you feel a gentle stretch in your lower back. Hold __________ seconds.  Slowly release your grasp and repeat the exercise with the opposite side. Repeat __________ times. Complete this exercise __________ times per day.  STRETCH - Flexion, Double Knee to Chest   Lie on a firm bed or floor with both legs extended in front of  you.  Keeping one leg in contact with the floor, bring your opposite knee to your chest.  Tense your stomach muscles to support your back and then lift your other knee to your chest. Hold your legs in place by either grabbing behind your thighs or at your knees.  Pull both knees toward your chest until you feel a gentle stretch in your lower back. Hold __________ seconds.  Tense your stomach muscles and slowly return one leg at a time to the floor. Repeat __________ times. Complete this exercise __________ times per day.  STRETCH - Low Trunk Rotation  Lie on a firm bed or floor. Keeping your legs in front of you, bend your knees so they are both pointed toward the ceiling and your feet are flat on the floor.  Extend your arms out to the side. This will stabilize your upper body by keeping your shoulders in contact with the floor.  Gently and slowly drop both knees together to one side until you feel a gentle stretch in your lower back. Hold for __________ seconds.  Tense your stomach muscles to support your lower back as you bring your knees back to the starting position. Repeat the exercise to the other side. Repeat __________ times. Complete this exercise __________ times per day  EXTENSION RANGE OF MOTION AND FLEXIBILITY EXERCISES: STRETCH - Extension, Prone on Elbows   Lie on your stomach on the floor, a bed will be too soft. Place your palms about shoulder width apart and at the height of your head.  Place your elbows under your shoulders. If this is too painful, stack pillows under your chest.  Allow your body to relax so that your hips drop lower and make contact more completely with the floor.  Hold this position for __________ seconds.  Slowly return to lying flat on the floor. Repeat __________ times. Complete this exercise __________ times per day.  RANGE OF MOTION - Extension, Prone Press Ups  Lie on your stomach on the floor, a bed will be too soft. Place your palms  about shoulder width apart and at the height of your head.  Keeping your back as relaxed as possible, slowly straighten your elbows while keeping your hips on the floor. You may adjust the placement of your hands to maximize your comfort. As you gain motion, your hands will come more underneath your shoulders.  Hold this  position __________ seconds.  Slowly return to lying flat on the floor. Repeat __________ times. Complete this exercise __________ times per day.  RANGE OF MOTION- Quadruped, Neutral Spine   Assume a hands and knees position on a firm surface. Keep your hands under your shoulders and your knees under your hips. You may place padding under your knees for comfort.  Drop your head and point your tail bone toward the ground below you. This will round out your lower back like an angry cat. Hold this position for __________ seconds.  Slowly lift your head and release your tail bone so that your back sags into a large arch, like an old horse.  Hold this position for __________ seconds.  Repeat this until you feel limber in your lower back.  Now, find your "sweet spot." This will be the most comfortable position somewhere between the two previous positions. This is your neutral spine. Once you have found this position, tense your stomach muscles to support your lower back.  Hold this position for __________ seconds. Repeat __________ times. Complete this exercise __________ times per day.  STRENGTHENING EXERCISES - Low Back Strain These exercises may help you when beginning to rehabilitate your injury. These exercises should be done near your "sweet spot." This is the neutral, low-back arch, somewhere between fully rounded and fully arched, that is your least painful position. When performed in this safe range of motion, these exercises can be used for people who have either a flexion or extension based injury. These exercises may resolve your symptoms with or without further  involvement from your physician, physical therapist or athletic trainer. While completing these exercises, remember:   Muscles can gain both the endurance and the strength needed for everyday activities through controlled exercises.  Complete these exercises as instructed by your physician, physical therapist or athletic trainer. Increase the resistance and repetitions only as guided.  You may experience muscle soreness or fatigue, but the pain or discomfort you are trying to eliminate should never worsen during these exercises. If this pain does worsen, stop and make certain you are following the directions exactly. If the pain is still present after adjustments, discontinue the exercise until you can discuss the trouble with your caregiver. STRENGTHENING - Deep Abdominals, Pelvic Tilt  Lie on a firm bed or floor. Keeping your legs in front of you, bend your knees so they are both pointed toward the ceiling and your feet are flat on the floor.  Tense your lower abdominal muscles to press your lower back into the floor. This motion will rotate your pelvis so that your tail bone is scooping upwards rather than pointing at your feet or into the floor.  With a gentle tension and even breathing, hold this position for __________ seconds. Repeat __________ times. Complete this exercise __________ times per day.  STRENGTHENING - Abdominals, Crunches   Lie on a firm bed or floor. Keeping your legs in front of you, bend your knees so they are both pointed toward the ceiling and your feet are flat on the floor. Cross your arms over your chest.  Slightly tip your chin down without bending your neck.  Tense your abdominals and slowly lift your trunk high enough to just clear your shoulder blades. Lifting higher can put excessive stress on the lower back and does not further strengthen your abdominal muscles.  Control your return to the starting position. Repeat __________ times. Complete this exercise  __________ times per day.  STRENGTHENING - Quadruped,  Opposite UE/LE Lift   Assume a hands and knees position on a firm surface. Keep your hands under your shoulders and your knees under your hips. You may place padding under your knees for comfort.  Find your neutral spine and gently tense your abdominal muscles so that you can maintain this position. Your shoulders and hips should form a rectangle that is parallel with the floor and is not twisted.  Keeping your trunk steady, lift your right hand no higher than your shoulder and then your left leg no higher than your hip. Make sure you are not holding your breath. Hold this position __________ seconds.  Continuing to keep your abdominal muscles tense and your back steady, slowly return to your starting position. Repeat with the opposite arm and leg. Repeat __________ times. Complete this exercise __________ times per day.  STRENGTHENING - Lower Abdominals, Double Knee Lift  Lie on a firm bed or floor. Keeping your legs in front of you, bend your knees so they are both pointed toward the ceiling and your feet are flat on the floor.  Tense your abdominal muscles to brace your lower back and slowly lift both of your knees until they come over your hips. Be certain not to hold your breath.  Hold __________ seconds. Using your abdominal muscles, return to the starting position in a slow and controlled manner. Repeat __________ times. Complete this exercise __________ times per day.  POSTURE AND BODY MECHANICS CONSIDERATIONS - Low Back Strain Keeping correct posture when sitting, standing or completing your activities will reduce the stress put on different body tissues, allowing injured tissues a chance to heal and limiting painful experiences. The following are general guidelines for improved posture. Your physician or physical therapist will provide you with any instructions specific to your needs. While reading these guidelines, remember:  The  exercises prescribed by your provider will help you have the flexibility and strength to maintain correct postures.  The correct posture provides the best environment for your joints to work. All of your joints have less wear and tear when properly supported by a spine with good posture. This means you will experience a healthier, less painful body.  Correct posture must be practiced with all of your activities, especially prolonged sitting and standing. Correct posture is as important when doing repetitive low-stress activities (typing) as it is when doing a single heavy-load activity (lifting). RESTING POSITIONS Consider which positions are most painful for you when choosing a resting position. If you have pain with flexion-based activities (sitting, bending, stooping, squatting), choose a position that allows you to rest in a less flexed posture. You would want to avoid curling into a fetal position on your side. If your pain worsens with extension-based activities (prolonged standing, working overhead), avoid resting in an extended position such as sleeping on your stomach. Most people will find more comfort when they rest with their spine in a more neutral position, neither too rounded nor too arched. Lying on a non-sagging bed on your side with a pillow between your knees, or on your back with a pillow under your knees will often provide some relief. Keep in mind, being in any one position for a prolonged period of time, no matter how correct your posture, can still lead to stiffness. PROPER SITTING POSTURE In order to minimize stress and discomfort on your spine, you must sit with correct posture. Sitting with good posture should be effortless for a healthy body. Returning to good posture is a gradual  process. Many people can work toward this most comfortably by using various supports until they have the flexibility and strength to maintain this posture on their own. When sitting with proper posture,  your ears will fall over your shoulders and your shoulders will fall over your hips. You should use the back of the chair to support your upper back. Your lower back will be in a neutral position, just slightly arched. You may place a small pillow or folded towel at the base of your lower back for support.  When working at a desk, create an environment that supports good, upright posture. Without extra support, muscles tire, which leads to excessive strain on joints and other tissues. Keep these recommendations in mind: CHAIR:  A chair should be able to slide under your desk when your back makes contact with the back of the chair. This allows you to work closely.  The chair's height should allow your eyes to be level with the upper part of your monitor and your hands to be slightly lower than your elbows. BODY POSITION  Your feet should make contact with the floor. If this is not possible, use a foot rest.  Keep your ears over your shoulders. This will reduce stress on your neck and lower back. INCORRECT SITTING POSTURES  If you are feeling tired and unable to assume a healthy sitting posture, do not slouch or slump. This puts excessive strain on your back tissues, causing more damage and pain. Healthier options include:  Using more support, like a lumbar pillow.  Switching tasks to something that requires you to be upright or walking.  Talking a brief walk.  Lying down to rest in a neutral-spine position. PROLONGED STANDING WHILE SLIGHTLY LEANING FORWARD  When completing a task that requires you to lean forward while standing in one place for a long time, place either foot up on a stationary 2-4 inch high object to help maintain the best posture. When both feet are on the ground, the lower back tends to lose its slight inward curve. If this curve flattens (or becomes too large), then the back and your other joints will experience too much stress, tire more quickly, and can cause  pain. CORRECT STANDING POSTURES Proper standing posture should be assumed with all daily activities, even if they only take a few moments, like when brushing your teeth. As in sitting, your ears should fall over your shoulders and your shoulders should fall over your hips. You should keep a slight tension in your abdominal muscles to brace your spine. Your tailbone should point down to the ground, not behind your body, resulting in an over-extended swayback posture.  INCORRECT STANDING POSTURES  Common incorrect standing postures include a forward head, locked knees and/or an excessive swayback. WALKING Walk with an upright posture. Your ears, shoulders and hips should all line-up. PROLONGED ACTIVITY IN A FLEXED POSITION When completing a task that requires you to bend forward at your waist or lean over a low surface, try to find a way to stabilize 3 out of 4 of your limbs. You can place a hand or elbow on your thigh or rest a knee on the surface you are reaching across. This will provide you more stability so that your muscles do not fatigue as quickly. By keeping your knees relaxed, or slightly bent, you will also reduce stress across your lower back. CORRECT LIFTING TECHNIQUES DO :   Assume a wide stance. This will provide you more stability and the  opportunity to get as close as possible to the object which you are lifting.  Tense your abdominals to brace your spine. Bend at the knees and hips. Keeping your back locked in a neutral-spine position, lift using your leg muscles. Lift with your legs, keeping your back straight.  Test the weight of unknown objects before attempting to lift them.  Try to keep your elbows locked down at your sides in order get the best strength from your shoulders when carrying an object.  Always ask for help when lifting heavy or awkward objects. INCORRECT LIFTING TECHNIQUES DO NOT:   Lock your knees when lifting, even if it is a small object.  Bend and  twist. Pivot at your feet or move your feet when needing to change directions.  Assume that you can safely pick up even a paper clip without proper posture.   This information is not intended to replace advice given to you by your health care provider. Make sure you discuss any questions you have with your health care provider.   Follow up with your primary care provider if symptoms do not improve. Apply ice to affected area. Take muscle relaxers and naproxen for pain relief. Return to the ED if you experience severe worsening of his symptoms, bowel/bladder incontinence, numbness/tingling in your lower extremities, abdominal pain, fever.

## 2015-12-29 NOTE — ED Notes (Signed)
Pt c/o L lower back pain x 13 days.  Pain score 7/10.  Denies injury.  Denies GU and GI complaints.  Pt reports taking OTC medication w/o relief.

## 2015-12-29 NOTE — ED Provider Notes (Signed)
CSN: [id number]     Arrival date & time 12/29/15  1103 History  By signing my name below, I, Teresa Cabrera, attest that this documentation has been prepared under the direction and in the presence of [employer], PA-C. Electronically Signed: Eustaquio Cabrera, ED Scribe. 12/29/2015. 12:08 PM.   Chief Complaint  Patient presents with  . Back Pain   The history is provided by the patient. No language interpreter was used.     HPI Comments: Teresa Cabrera is a 55 y.o. female who presents to the Emergency Department complaining of gradual onset, constant, 7/10, left lower back pain that began approximately 2 weeks ago, gradually worsening. Pt states that she was hiking the day before the pain came on and believes she over exerted herself. No known injury to the back. The pain is exacerbated with movement. There are no alleviating factors to the pain. She has been taking Ibuprofen without relief. Denies numbness, weakness, tingling, urinary or bowel incontinence, fever, difficulty urinating, rash, or any other associated symptoms. Pt is able to ambulate without difficulty.    Past Medical History  Diagnosis Date  . Anemia   . Anxiety   . Depression   . GERD (gastroesophageal reflux disease)   . Hyperlipidemia   . Hypertension   . Substance abuse   . Insomnia    Past Surgical History  Procedure Laterality Date  . Tubal ligation     Family History  Problem Relation Age of Onset  . Other Mother     lou gehrigs disease  . Breast cancer Sister   . Cancer Paternal Grandmother    Social History  Substance Use Topics  . Smoking status: Current Some Day Smoker -- 0.25 packs/day    Types: Cigarettes  . Smokeless tobacco: Never Used  . Alcohol Use: 8.0 oz/week    16 Standard drinks or equivalent per week     Comment: 1/2 pt. daily   OB History    Gravida Para Term Preterm AB TAB SAB Ectopic Multiple Living   5 2   3     2      Review of Systems  Constitutional: Negative for fever.   Gastrointestinal:       Negative for bowel incontinence  Genitourinary: Negative for difficulty urinating.       Negative for urinary incontinence  Musculoskeletal: Positive for back pain. Negative for gait problem.  Skin: Negative for rash.  Neurological: Negative for weakness and numbness.  All other systems reviewed and are negative.  Allergies  Alprazolam and Crestor  Home Medications   Prior to Admission medications   Medication Sig Start Date End Date Taking? Authorizing Provider  atenolol-chlorthalidone (TENORETIC) 50-25 MG per tablet Take 1 tablet by mouth at bedtime. 08/08/11   Historical Provider, MD  CALCIUM PO Take by mouth.    Historical Provider, MD  docusate sodium (COLACE) 100 MG capsule Take 100 mg by mouth 2 (two) times daily.    Historical Provider, MD  Ferrous Sulfate (IRON) 325 (65 FE) MG TABS Take 325 mg by mouth 2 (two) times daily. 04/09/12   Terrance Mass, MD  fish oil-omega-3 fatty acids 1000 MG capsule Take 2 g by mouth daily.    Historical Provider, MD  fluconazole (DIFLUCAN) 150 MG tablet Take 1 tablet by mouth Daily. 08/14/11   Historical Provider, MD  pantoprazole (PROTONIX) 40 MG tablet Take 1 capsule by mouth Daily. 08/02/11   Historical Provider, MD  polysaccharide iron (NIFEREX) 150 MG CAPS capsule Take  150 mg by mouth daily.    Historical Provider, MD  potassium chloride (K-DUR,KLOR-CON) 10 MEQ tablet Take 1 tablet by mouth Daily. 07/20/11   Historical Provider, MD  zolpidem (AMBIEN CR) 12.5 MG CR tablet Take 1 tablet by mouth at bedtime. 08/02/11   Historical Provider, MD   Triage Vitals:  BP 120/86 mmHg  Pulse 65  Temp(Src) 98.2 F (36.8 C) (Oral)  Resp 16  SpO2 100%   Physical Exam  Constitutional: She is oriented to person, place, and time. She appears well-developed and well-nourished. No distress.  HENT:  Head: Normocephalic and atraumatic.  Eyes: Conjunctivae are normal. Right eye exhibits no discharge. Left eye exhibits no discharge.  No scleral icterus.  Cardiovascular: Normal rate.   Pulmonary/Chest: Effort normal.  Musculoskeletal: She exhibits tenderness.  Full range of motion of cervical thoracic and lumbar spine. Left lumbar paraspinal muscle tenderness  No midline spinal tenderness. No step-offs or obvious bony deformities. No gait abnormality  Neurological: She is alert and oriented to person, place, and time. Coordination normal.  Strength 5/5 throughout. No sensory deficits.    Skin: Skin is warm and dry. No rash noted. She is not diaphoretic. No erythema. No pallor.  Psychiatric: She has a normal mood and affect. Her behavior is normal.  Nursing note and vitals reviewed.   ED Course  Procedures (including critical care time)  DIAGNOSTIC STUDIES: Oxygen Saturation is 100% on RA, normal by my interpretation.    COORDINATION OF CARE: 12:06 PM-Discussed treatment plan which includes Rx Naproxen and Flexeril with pt at bedside and pt agreed to plan.   Labs Review Labs Reviewed - No data to display  Imaging Review No results found.   EKG Interpretation None      MDM   Final diagnoses:  Lumbar strain, initial encounter   Patient with back pain.  No neurological deficits and normal neuro exam.  Patient is ambulatory.  No loss of bowel or bladder control.  No concern for cauda equina.  No fever, night sweats, weight loss, h/o cancer, IVDA, no recent procedure to back. No urinary symptoms suggestive of UTI. Suspect lumbar strain.  RICE precautions recommended. Supportive care and return precaution discussed. Appears safe for discharge at this time. Follow up as indicated in discharge paperwork.    I personally performed the services described in this documentation, which was scribed in my presence. The recorded information has been reviewed and is accurate.       [place], PA-C 12/30/15 Lafayette Liu, MD 12/31/15 [phone]

## 2016-06-10 DIAGNOSIS — R928 Other abnormal and inconclusive findings on diagnostic imaging of breast: Secondary | ICD-10-CM | POA: Insufficient documentation

## 2016-06-10 DIAGNOSIS — Z803 Family history of malignant neoplasm of breast: Secondary | ICD-10-CM | POA: Insufficient documentation

## 2017-05-07 ENCOUNTER — Encounter: Payer: Self-pay | Admitting: Gynecology

## 2019-12-28 DIAGNOSIS — Z30432 Encounter for removal of intrauterine contraceptive device: Secondary | ICD-10-CM | POA: Insufficient documentation

## 2021-04-09 ENCOUNTER — Ambulatory Visit (INDEPENDENT_AMBULATORY_CARE_PROVIDER_SITE_OTHER): Payer: No Typology Code available for payment source | Admitting: Internal Medicine

## 2021-04-09 ENCOUNTER — Other Ambulatory Visit: Payer: Self-pay

## 2021-04-09 ENCOUNTER — Encounter: Payer: Self-pay | Admitting: Internal Medicine

## 2021-04-09 VITALS — BP 144/92 | HR 64 | Temp 97.9°F | Ht 65.0 in | Wt 152.4 lb

## 2021-04-09 DIAGNOSIS — Z13228 Encounter for screening for other metabolic disorders: Secondary | ICD-10-CM

## 2021-04-09 DIAGNOSIS — Z7689 Persons encountering health services in other specified circumstances: Secondary | ICD-10-CM

## 2021-04-09 DIAGNOSIS — Z862 Personal history of diseases of the blood and blood-forming organs and certain disorders involving the immune mechanism: Secondary | ICD-10-CM

## 2021-04-09 DIAGNOSIS — Z1159 Encounter for screening for other viral diseases: Secondary | ICD-10-CM

## 2021-04-09 DIAGNOSIS — Z1231 Encounter for screening mammogram for malignant neoplasm of breast: Secondary | ICD-10-CM

## 2021-04-09 DIAGNOSIS — Z114 Encounter for screening for human immunodeficiency virus [HIV]: Secondary | ICD-10-CM

## 2021-04-09 DIAGNOSIS — I10 Essential (primary) hypertension: Secondary | ICD-10-CM

## 2021-04-09 NOTE — Progress Notes (Signed)
Subjective:    Teresa Cabrera - 60 y.o. female MRN [id number]  Date of birth: 20-Sep-1961  HPI  Sandeep Rosado is to establish care. Was previously seen by Eldridge Abrahams, NP. She reports that she had a full exam last year. Patient has a PMH significant for HTN. She reports compliance with her medication. She donates plasma twice a week and her BP is well controlled at those visits. 122/81 last Thursday.    ROS per HPI    Health Maintenance:  Health Maintenance Due  Topic Date Due  . Hepatitis C Screening  Never done  . HIV Screening  Never done  . MAMMOGRAM  Never done  . PAP SMEAR-Modifier  07/07/2015     Past Medical History: Patient Active Problem List   Diagnosis Date Noted  . DUB (dysfunctional uterine bleeding) 04/09/2012  . Alcohol abuse 04/09/2012  . Smoker 04/09/2012  . HTN (hypertension) 04/09/2012  . Active smoker 04/09/2012  . Insomnia       Social History   reports that she has been smoking cigarettes. She has been smoking about 0.25 packs per day. She has never used smokeless tobacco. She reports current alcohol use of about 16.0 standard drinks of alcohol per week. She reports that she does not use drugs.   Family History  family history includes Breast cancer in her sister; Cancer in her paternal grandmother; Other in her mother.   Medications: reviewed and updated   Objective:   Physical Exam BP (!) 144/92   Pulse 64   Temp 97.9 F (36.6 C) (Oral)   Ht 5\' 5"  (1.651 m)   Wt 152 lb 6.4 oz (69.1 kg)   SpO2 98%   BMI 25.36 kg/m  Physical Exam Constitutional:      General: She is not in acute distress.    Appearance: She is not diaphoretic.  HENT:     Head: Normocephalic and atraumatic.  Eyes:     Conjunctiva/sclera: Conjunctivae normal.  Cardiovascular:     Rate and Rhythm: Normal rate and regular rhythm.     Heart sounds: Normal heart sounds. No murmur heard.   Pulmonary:     Effort: Pulmonary effort is normal. No respiratory distress.      Breath sounds: Normal breath sounds.  Musculoskeletal:        General: Normal range of motion.  Skin:    General: Skin is warm and dry.  Neurological:     Mental Status: She is alert and oriented to person, place, and time.  Psychiatric:        Mood and Affect: Affect normal.        Judgment: Judgment normal.         Assessment & Plan:   1. Encounter to establish care Reviewed patient's PMH, social history, surgical history, and medications.   2. Encounter for screening mammogram for malignant neoplasm of breast - MM DIGITAL SCREENING BILATERAL; Future  3. Primary hypertension BP slightly elevated today but reports well controlled on checks during plasma donations. She is asymptomatic. Continue current regimen. Monitor electrolytes.  - Comprehensive metabolic panel  4. Screening for metabolic disorder - Comprehensive metabolic panel - Lipid panel  5. History of iron deficiency anemia Previously on Fe supplements, no longer taking. Monitor CBC>  - CBC  6. Screening for HIV (human immunodeficiency virus) - HIV Antibody (routine testing w rflx)  7. Need for hepatitis C screening test - Hepatitis C antibody    Phill Myron, D.O. 04/09/2021, 9:07 AM Primary Care  at [facility]

## 2021-04-09 NOTE — Progress Notes (Signed)
Pt has been having left sided back pain. Left side get numbs at times.

## 2021-04-10 LAB — CBC
Hematocrit: 41.1 % (ref 34.0–46.6)
Hemoglobin: 14.6 g/dL (ref 11.1–15.9)
MCH: 32.9 pg (ref 26.6–33.0)
MCHC: 35.5 g/dL (ref 31.5–35.7)
MCV: 93 fL (ref 79–97)
Platelets: 430 10*3/uL (ref 150–450)
RBC: 4.44 x10E6/uL (ref 3.77–5.28)
RDW: 13.5 % (ref 11.7–15.4)
WBC: 7.4 10*3/uL (ref 3.4–10.8)

## 2021-04-10 LAB — COMPREHENSIVE METABOLIC PANEL
ALT: 22 IU/L (ref 0–32)
AST: 28 IU/L (ref 0–40)
Albumin/Globulin Ratio: 1.8 (ref 1.2–2.2)
Albumin: 4 g/dL (ref 3.8–4.9)
Alkaline Phosphatase: 41 IU/L — ABNORMAL LOW (ref 44–121)
BUN/Creatinine Ratio: 7 — ABNORMAL LOW (ref 12–28)
BUN: 7 mg/dL — ABNORMAL LOW (ref 8–27)
Bilirubin Total: 0.2 mg/dL (ref 0.0–1.2)
CO2: 23 mmol/L (ref 20–29)
Calcium: 9.6 mg/dL (ref 8.7–10.3)
Chloride: 101 mmol/L (ref 96–106)
Creatinine, Ser: 1.04 mg/dL — ABNORMAL HIGH (ref 0.57–1.00)
Globulin, Total: 2.2 g/dL (ref 1.5–4.5)
Glucose: 93 mg/dL (ref 65–99)
Potassium: 3.6 mmol/L (ref 3.5–5.2)
Sodium: 141 mmol/L (ref 134–144)
Total Protein: 6.2 g/dL (ref 6.0–8.5)
eGFR: 62 mL/min/{1.73_m2} (ref >59)

## 2021-04-10 LAB — LIPID PANEL
Chol/HDL Ratio: 2.8 ratio (ref 0.0–4.4)
Cholesterol, Total: 198 mg/dL (ref 100–199)
HDL: 71 mg/dL (ref >39)
LDL Chol Calc (NIH): 104 mg/dL — ABNORMAL HIGH (ref 0–99)
Triglycerides: 134 mg/dL (ref 0–149)
VLDL Cholesterol Cal: 23 mg/dL (ref 5–40)

## 2021-04-10 LAB — HEPATITIS C ANTIBODY: Hep C Virus Ab: <0.1 s/co ratio (ref 0.0–0.9)

## 2021-04-17 ENCOUNTER — Ambulatory Visit: Payer: Managed Care, Other (non HMO) | Admitting: Internal Medicine

## 2021-09-04 ENCOUNTER — Encounter: Payer: Self-pay | Admitting: Gastroenterology

## 2022-01-29 ENCOUNTER — Ambulatory Visit (INDEPENDENT_AMBULATORY_CARE_PROVIDER_SITE_OTHER): Payer: BLUE CROSS/BLUE SHIELD | Admitting: Family Medicine

## 2022-01-29 ENCOUNTER — Other Ambulatory Visit: Payer: Self-pay

## 2022-01-29 ENCOUNTER — Encounter: Payer: Self-pay | Admitting: Family Medicine

## 2022-01-29 VITALS — BP 134/84 | HR 76 | Temp 98.1°F | Resp 16 | Ht 65.0 in | Wt 152.6 lb

## 2022-01-29 DIAGNOSIS — Z1231 Encounter for screening mammogram for malignant neoplasm of breast: Secondary | ICD-10-CM

## 2022-01-29 DIAGNOSIS — Z13228 Encounter for screening for other metabolic disorders: Secondary | ICD-10-CM

## 2022-01-29 DIAGNOSIS — Z13220 Encounter for screening for lipoid disorders: Secondary | ICD-10-CM

## 2022-01-29 DIAGNOSIS — Z1211 Encounter for screening for malignant neoplasm of colon: Secondary | ICD-10-CM | POA: Diagnosis not present

## 2022-01-29 DIAGNOSIS — Z23 Encounter for immunization: Secondary | ICD-10-CM | POA: Diagnosis not present

## 2022-01-29 DIAGNOSIS — Z0000 Encounter for general adult medical examination without abnormal findings: Secondary | ICD-10-CM

## 2022-01-29 DIAGNOSIS — Z130 Encounter for screening for diseases of the blood and blood-forming organs and certain disorders involving the immune mechanism: Secondary | ICD-10-CM

## 2022-01-29 DIAGNOSIS — Z1329 Encounter for screening for other suspected endocrine disorder: Secondary | ICD-10-CM

## 2022-01-29 NOTE — Progress Notes (Signed)
Patient is here for CPE. Patient has not been to PCP in over a year and would a check-up

## 2022-01-29 NOTE — Progress Notes (Signed)
Established Patient Office Visit  Subjective:  Patient ID: Teresa Cabrera, female    DOB: [birth year]  Age: 61 y.o. MRN: [id number]  CC:  Chief Complaint  Patient presents with   Annual Exam    HPI Teresa Cabrera presents for routine annual exam. Denies acute complaints or concerns.   Past Medical History:  Diagnosis Date   Anemia    Anxiety    Depression    GERD (gastroesophageal reflux disease)    Hyperlipidemia    Hypertension    Insomnia    Substance abuse ([place])     Past Surgical History:  Procedure Laterality Date   TUBAL LIGATION      Family History  Problem Relation Age of Onset   Other Mother        lou gehrigs disease   Breast cancer Sister    Cancer Paternal Grandmother     Social History   Socioeconomic History   Marital status: Married    Spouse name: Not on file   Number of children: Not on file   Years of education: Not on file   Highest education level: Not on file  Occupational History   Not on file  Tobacco Use   Smoking status: Some Days    Packs/day: 0.25    Types: Cigarettes   Smokeless tobacco: Never  Substance and Sexual Activity   Alcohol use: Yes    Alcohol/week: 16.0 standard drinks    Types: 16 Standard drinks or equivalent per week    Comment: 1/2 pt. daily   Drug use: No   Sexual activity: Not on file  Other Topics Concern   Not on file  Social History Narrative   Not on file   Social Determinants of Health   Financial Resource Strain: Not on file  Food Insecurity: Not on file  Transportation Needs: Not on file  Physical Activity: Not on file  Stress: Not on file  Social Connections: Not on file  Intimate Partner Violence: Not on file    ROS Review of Systems  All other systems reviewed and are negative.  Objective:   Today's Vitals: BP 134/84    Pulse 76    Temp 98.1 F (36.7 C) (Oral)    Resp 16    Ht 5' 5"  (1.651 m)    Wt 152 lb 9.6 oz (69.2 kg)    SpO2 98%    BMI 25.39 kg/m   Physical Exam Vitals  and nursing note reviewed.  Constitutional:      General: She is not in acute distress. HENT:     Head: Normocephalic and atraumatic.     Right Ear: Tympanic membrane, ear canal and external ear normal.     Left Ear: Tympanic membrane, ear canal and external ear normal.     Nose: Nose normal.     Mouth/Throat:     Mouth: Mucous membranes are moist.     Pharynx: Oropharynx is clear.  Eyes:     Conjunctiva/sclera: Conjunctivae normal.     Pupils: Pupils are equal, round, and reactive to light.  Neck:     Thyroid: No thyromegaly.  Cardiovascular:     Rate and Rhythm: Normal rate and regular rhythm.     Heart sounds: Normal heart sounds. No murmur heard. Pulmonary:     Effort: Pulmonary effort is normal. No respiratory distress.     Breath sounds: Normal breath sounds.  Abdominal:     General: There is no distension.     Palpations:  Abdomen is soft. There is no mass.     Tenderness: There is no abdominal tenderness.  Musculoskeletal:        General: Normal range of motion.     Cervical back: Normal range of motion and neck supple.  Skin:    General: Skin is warm and dry.  Neurological:     General: No focal deficit present.     Mental Status: She is alert and oriented to person, place, and time.  Psychiatric:        Mood and Affect: Mood normal.        Behavior: Behavior normal.    Assessment & Plan:   1. Annual physical exam Routine labs ordered - CMP14+EGFR - CBC with Differential  2. Encounter for screening mammogram for malignant neoplasm of breast Referral for mammogram - MM Digital Screening; Future  3. Screening for colon cancer  - Ambulatory referral to Gastroenterology  4. Screening for deficiency anemia   5. Screening for endocrine/metabolic/immunity disorders  - HgB A1c - TSH  6. Need for influenza vaccination  - Flu Vaccine QUAD 16moIM (Fluarix, Fluzone & Alfiuria Quad PF)  7. Need for lipid screening  - Lipid Panel  Outpatient Encounter  Medications as of 01/29/2022  Medication Sig   atenolol-chlorthalidone (TENORETIC) 50-25 MG per tablet Take 1 tablet by mouth at bedtime.   naproxen (NAPROSYN) 500 MG tablet Take 1 tablet (500 mg total) by mouth 2 (two) times daily.   omeprazole (PRILOSEC) 40 MG capsule Take 40 mg by mouth daily.   potassium chloride (K-DUR,KLOR-CON) 10 MEQ tablet Take 1 tablet by mouth Daily.   pravastatin (PRAVACHOL) 20 MG tablet every evening.   omeprazole (PRILOSEC) 40 MG capsule Take 1 tablet by mouth daily. (Patient not taking: Reported on 01/29/2022)   No facility-administered encounter medications on file as of 01/29/2022.    Follow-up: No follow-ups on file.   WBecky Sax MD

## 2022-01-30 LAB — CMP14+EGFR
ALT: 23 IU/L (ref 0–32)
AST: 25 IU/L (ref 0–40)
Albumin/Globulin Ratio: 1.7 (ref 1.2–2.2)
Albumin: 4.4 g/dL (ref 3.8–4.8)
Alkaline Phosphatase: 59 IU/L (ref 44–121)
BUN/Creatinine Ratio: 11 — ABNORMAL LOW (ref 12–28)
BUN: 11 mg/dL (ref 8–27)
Bilirubin Total: 0.3 mg/dL (ref 0.0–1.2)
CO2: 23 mmol/L (ref 20–29)
Calcium: 9.7 mg/dL (ref 8.7–10.3)
Chloride: 99 mmol/L (ref 96–106)
Creatinine, Ser: 1 mg/dL (ref 0.57–1.00)
Globulin, Total: 2.6 g/dL (ref 1.5–4.5)
Glucose: 107 mg/dL — ABNORMAL HIGH (ref 70–99)
Potassium: 3.2 mmol/L — ABNORMAL LOW (ref 3.5–5.2)
Sodium: 141 mmol/L (ref 134–144)
Total Protein: 7 g/dL (ref 6.0–8.5)
eGFR: 64 mL/min/{1.73_m2} (ref >59)

## 2022-01-30 LAB — LIPID PANEL
Chol/HDL Ratio: 2.9 ratio (ref 0.0–4.4)
Cholesterol, Total: 216 mg/dL — ABNORMAL HIGH (ref 100–199)
HDL: 75 mg/dL (ref >39)
LDL Chol Calc (NIH): 97 mg/dL (ref 0–99)
Triglycerides: 263 mg/dL — ABNORMAL HIGH (ref 0–149)
VLDL Cholesterol Cal: 44 mg/dL — ABNORMAL HIGH (ref 5–40)

## 2022-01-30 LAB — CBC WITH DIFFERENTIAL/PLATELET
Basophils Absolute: 0 10*3/uL (ref 0.0–0.2)
Basos: 1 %
EOS (ABSOLUTE): 0.1 10*3/uL (ref 0.0–0.4)
Eos: 1 %
Hematocrit: 42.9 % (ref 34.0–46.6)
Hemoglobin: 15 g/dL (ref 11.1–15.9)
Immature Grans (Abs): 0 10*3/uL (ref 0.0–0.1)
Immature Granulocytes: 0 %
Lymphocytes Absolute: 2.3 10*3/uL (ref 0.7–3.1)
Lymphs: 32 %
MCH: 31.6 pg (ref 26.6–33.0)
MCHC: 35 g/dL (ref 31.5–35.7)
MCV: 90 fL (ref 79–97)
Monocytes Absolute: 0.4 10*3/uL (ref 0.1–0.9)
Monocytes: 5 %
Neutrophils Absolute: 4.2 10*3/uL (ref 1.4–7.0)
Neutrophils: 61 %
Platelets: 394 10*3/uL (ref 150–450)
RBC: 4.75 x10E6/uL (ref 3.77–5.28)
RDW: 13.2 % (ref 11.7–15.4)
WBC: 7 10*3/uL (ref 3.4–10.8)

## 2022-01-30 LAB — TSH: TSH: 1.63 u[IU]/mL (ref 0.450–4.500)

## 2022-02-01 ENCOUNTER — Telehealth: Payer: Self-pay | Admitting: Family Medicine

## 2022-02-01 ENCOUNTER — Other Ambulatory Visit: Payer: Self-pay | Admitting: *Deleted

## 2022-02-01 DIAGNOSIS — Z1231 Encounter for screening mammogram for malignant neoplasm of breast: Secondary | ICD-10-CM

## 2022-02-01 DIAGNOSIS — Z1211 Encounter for screening for malignant neoplasm of colon: Secondary | ICD-10-CM

## 2022-02-01 MED ORDER — OMEPRAZOLE 40 MG PO CPDR: 40.0000 mg | DELAYED_RELEASE_CAPSULE | Freq: Every day | ORAL | 0 refills | Status: DC

## 2022-02-01 NOTE — Telephone Encounter (Signed)
Pt asking about colonoscopy and breast exam and also needing urgent refill for heart burn: Asking if it can be filled today  omeprazole (PRILOSEC) 40 MG capsule [[id number]]  Pharmacy  [id number] - [place], [address] [place] [address]  Phone:  [phone]  Fax:  [phone]  DEA #:  [id number]

## 2022-02-14 ENCOUNTER — Ambulatory Visit: Payer: BLUE CROSS/BLUE SHIELD | Admitting: Family Medicine

## 2022-02-26 ENCOUNTER — Other Ambulatory Visit (HOSPITAL_COMMUNITY)
Admission: RE | Admit: 2022-02-26 | Discharge: 2022-02-26 | Disposition: A | Discharge status: Home / Self Care | Payer: BLUE CROSS/BLUE SHIELD | Source: Ambulatory Visit | Attending: Family Medicine | Admitting: Family Medicine

## 2022-02-26 ENCOUNTER — Other Ambulatory Visit: Payer: Self-pay

## 2022-02-26 ENCOUNTER — Ambulatory Visit (INDEPENDENT_AMBULATORY_CARE_PROVIDER_SITE_OTHER): Payer: BLUE CROSS/BLUE SHIELD | Admitting: Family Medicine

## 2022-02-26 ENCOUNTER — Encounter: Payer: Self-pay | Admitting: Family Medicine

## 2022-02-26 ENCOUNTER — Encounter (INDEPENDENT_AMBULATORY_CARE_PROVIDER_SITE_OTHER): Payer: Self-pay

## 2022-02-26 VITALS — BP 123/83 | HR 77 | Temp 98.1°F | Resp 16 | Ht 65.0 in | Wt 154.4 lb

## 2022-02-26 DIAGNOSIS — Z1231 Encounter for screening mammogram for malignant neoplasm of breast: Secondary | ICD-10-CM

## 2022-02-26 DIAGNOSIS — Z1151 Encounter for screening for human papillomavirus (HPV): Secondary | ICD-10-CM | POA: Insufficient documentation

## 2022-02-26 DIAGNOSIS — Z124 Encounter for screening for malignant neoplasm of cervix: Secondary | ICD-10-CM | POA: Insufficient documentation

## 2022-02-26 DIAGNOSIS — Z1329 Encounter for screening for other suspected endocrine disorder: Secondary | ICD-10-CM

## 2022-02-26 DIAGNOSIS — Z0000 Encounter for general adult medical examination without abnormal findings: Secondary | ICD-10-CM

## 2022-02-26 DIAGNOSIS — Z130 Encounter for screening for diseases of the blood and blood-forming organs and certain disorders involving the immune mechanism: Secondary | ICD-10-CM

## 2022-02-26 DIAGNOSIS — Z13220 Encounter for screening for lipoid disorders: Secondary | ICD-10-CM

## 2022-02-26 MED ORDER — TRAZODONE HCL 100 MG PO TABS: 100.0000 mg | ORAL_TABLET | Freq: Every day | ORAL | 0 refills | Status: DC

## 2022-02-27 ENCOUNTER — Encounter: Payer: Self-pay | Admitting: Family Medicine

## 2022-02-27 LAB — CERVICOVAGINAL ANCILLARY ONLY
Bacterial Vaginitis (gardnerella): POSITIVE — AB
Candida Glabrata: NEGATIVE
Candida Vaginitis: POSITIVE — AB
Chlamydia: NEGATIVE
Comment: NEGATIVE
Comment: NEGATIVE
Comment: NEGATIVE
Comment: NEGATIVE
Comment: NEGATIVE
Comment: NORMAL
Neisseria Gonorrhea: NEGATIVE
Trichomonas: NEGATIVE

## 2022-02-27 NOTE — Progress Notes (Signed)
? ?Established Patient Office Visit ? ?Subjective:  ?Patient ID: Teresa Cabrera, female    DOB: Dec 26, 1960  Age: 61 y.o. MRN: [id number] ? ?CC:  ?Chief Complaint  ?Patient presents with  ? Annual Exam  ? Gynecologic Exam  ? ? ?HPI ?Seriah Kalata presents for routine pap smear. Denies acute complaints or concerns.  ? ?Past Medical History:  ?Diagnosis Date  ? Anemia   ? Anxiety   ? Depression   ? GERD (gastroesophageal reflux disease)   ? Hyperlipidemia   ? Hypertension   ? Insomnia   ? Substance abuse ([place])   ? ? ?Past Surgical History:  ?Procedure Laterality Date  ? TUBAL LIGATION    ? ? ?Family History  ?Problem Relation Age of Onset  ? Other Mother   ?     lou gehrigs disease  ? Breast cancer Sister   ? Cancer Paternal Grandmother   ? ? ?Social History  ? ?Socioeconomic History  ? Marital status: Married  ?  Spouse name: Not on file  ? Number of children: Not on file  ? Years of education: Not on file  ? Highest education level: Not on file  ?Occupational History  ? Not on file  ?Tobacco Use  ? Smoking status: Some Days  ?  Packs/day: 0.25  ?  Types: Cigarettes  ? Smokeless tobacco: Never  ?Substance and Sexual Activity  ? Alcohol use: Yes  ?  Alcohol/week: 16.0 standard drinks  ?  Types: 16 Standard drinks or equivalent per week  ?  Comment: 1/2 pt. daily  ? Drug use: No  ? Sexual activity: Not on file  ?Other Topics Concern  ? Not on file  ?Social History Narrative  ? Not on file  ? ?Social Determinants of Health  ? ?Financial Resource Strain: Not on file  ?Food Insecurity: Not on file  ?Transportation Needs: Not on file  ?Physical Activity: Not on file  ?Stress: Not on file  ?Social Connections: Not on file  ?Intimate Partner Violence: Not on file  ? ? ?ROS ?Review of Systems  ?Genitourinary:  Negative for vaginal bleeding, vaginal discharge and vaginal pain.  ?All other systems reviewed and are negative. ? ?Objective:  ? ?Today's Vitals: BP 123/83   Pulse 77   Temp 98.1 ?F (36.7 ?C) (Oral)   Resp 16   Ht  '5\' 5"'$  (1.651 m)   Wt 154 lb 6.4 oz (70 kg)   SpO2 92%   BMI 25.69 kg/m?  ? ?Physical Exam ?Vitals and nursing note reviewed.  ?Cardiovascular:  ?   Rate and Rhythm: Normal rate and regular rhythm.  ?Pulmonary:  ?   Effort: Pulmonary effort is normal.  ?   Breath sounds: Normal breath sounds.  ?Abdominal:  ?   Palpations: Abdomen is soft.  ?   Tenderness: There is no abdominal tenderness.  ?   Hernia: There is no hernia in the right inguinal area.  ?Genitourinary: ?   Exam position: Supine.  ?   Labia:     ?   Right: No lesion.     ?   Left: No lesion.   ?   Vagina: Vaginal discharge present.  ?   Cervix: Normal.  ?   Uterus: Normal.   ?   Adnexa: Right adnexa normal and left adnexa normal.  ?   Rectum: Normal.  ? ? ?Assessment & Plan:  ? ?1. Pap smear for cervical cancer screening ?Awaiting pap results ?- Cervicovaginal ancillary only ?- Cytology -  PAP ? ?2. Encounter for screening mammogram for malignant neoplasm of breast ?Referred for mammogram ?- MM Digital Screening; Future ? ? ? ?Outpatient Encounter Medications as of 02/26/2022  ?Medication Sig  ? atenolol-chlorthalidone (TENORETIC) 50-25 MG per tablet Take 1 tablet by mouth at bedtime.  ? naproxen (NAPROSYN) 500 MG tablet Take 1 tablet (500 mg total) by mouth 2 (two) times daily.  ? omeprazole (PRILOSEC) 40 MG capsule Take 1 capsule (40 mg total) by mouth daily.  ? potassium chloride (K-DUR,KLOR-CON) 10 MEQ tablet Take 1 tablet by mouth Daily.  ? pravastatin (PRAVACHOL) 20 MG tablet every evening.  ? traZODone (DESYREL) 100 MG tablet Take 1 tablet (100 mg total) by mouth at bedtime.  ? omeprazole (PRILOSEC) 40 MG capsule Take 1 tablet by mouth daily. (Patient not taking: Reported on 02/26/2022)  ? [DISCONTINUED] KLOR-CON M20 20 MEQ tablet Take 20 mEq by mouth 2 (two) times daily.  ? ?No facility-administered encounter medications on file as of 02/26/2022.  ? ? ?Follow-up: No follow-ups on file.  ? ?Becky Sax, MD ? ?

## 2022-02-28 ENCOUNTER — Other Ambulatory Visit: Payer: Self-pay | Admitting: Family Medicine

## 2022-02-28 MED ORDER — FLUCONAZOLE 150 MG PO TABS: 150.0000 mg | ORAL_TABLET | Freq: Once | ORAL | 0 refills | Status: AC

## 2022-02-28 MED ORDER — METRONIDAZOLE 500 MG PO TABS: 500.0000 mg | ORAL_TABLET | Freq: Two times a day (BID) | ORAL | 0 refills | Status: AC

## 2022-02-28 NOTE — Progress Notes (Signed)
Flucan ? ?

## 2022-03-04 LAB — CYTOLOGY - PAP: Diagnosis: NEGATIVE

## 2022-03-08 ENCOUNTER — Other Ambulatory Visit: Payer: Self-pay | Admitting: Family Medicine

## 2022-03-08 MED ORDER — FLUCONAZOLE 150 MG PO TABS: 150.0000 mg | ORAL_TABLET | Freq: Once | ORAL | 0 refills | Status: AC

## 2022-03-09 ENCOUNTER — Other Ambulatory Visit: Payer: Self-pay

## 2022-03-09 ENCOUNTER — Ambulatory Visit
Admission: EM | Admit: 2022-03-09 | Discharge: 2022-03-09 | Disposition: A | Discharge status: Home / Self Care | Payer: BLUE CROSS/BLUE SHIELD | Attending: Physician Assistant | Admitting: Physician Assistant

## 2022-03-09 ENCOUNTER — Ambulatory Visit (INDEPENDENT_AMBULATORY_CARE_PROVIDER_SITE_OTHER): Payer: BLUE CROSS/BLUE SHIELD

## 2022-03-09 DIAGNOSIS — R079 Chest pain, unspecified: Secondary | ICD-10-CM

## 2022-03-09 DIAGNOSIS — M546 Pain in thoracic spine: Secondary | ICD-10-CM

## 2022-03-09 MED ORDER — CYCLOBENZAPRINE HCL 10 MG PO TABS: 10.0000 mg | ORAL_TABLET | Freq: Two times a day (BID) | ORAL | 0 refills | Status: DC | PRN

## 2022-03-09 MED ORDER — PREDNISONE 20 MG PO TABS: 40.0000 mg | ORAL_TABLET | Freq: Every day | ORAL | 0 refills | Status: AC
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
## 2022-03-15 ENCOUNTER — Ambulatory Visit: Payer: BLUE CROSS/BLUE SHIELD

## 2022-04-25 ENCOUNTER — Encounter: Payer: Self-pay | Admitting: Gastroenterology

## 2022-06-03 ENCOUNTER — Ambulatory Visit (AMBULATORY_SURGERY_CENTER): Payer: Self-pay | Admitting: *Deleted

## 2022-06-03 VITALS — Ht 65.0 in | Wt 149.6 lb

## 2022-06-03 DIAGNOSIS — Z1211 Encounter for screening for malignant neoplasm of colon: Secondary | ICD-10-CM

## 2022-06-03 MED ORDER — NA SULFATE-K SULFATE-MG SULF 17.5-3.13-1.6 GM/177ML PO SOLN: 1.0000 | Freq: Once | ORAL | 0 refills | Status: AC

## 2022-06-03 NOTE — Progress Notes (Signed)

## 2022-06-09 ENCOUNTER — Encounter (HOSPITAL_COMMUNITY): Payer: Self-pay

## 2022-06-09 ENCOUNTER — Other Ambulatory Visit: Payer: Self-pay

## 2022-06-09 ENCOUNTER — Emergency Department (HOSPITAL_COMMUNITY)
Admission: EM | Admit: 2022-06-09 | Discharge: 2022-06-09 | Disposition: A | Discharge status: Home / Self Care | Payer: BLUE CROSS/BLUE SHIELD | Attending: Emergency Medicine | Admitting: Emergency Medicine

## 2022-06-09 DIAGNOSIS — Z5321 Procedure and treatment not carried out due to patient leaving prior to being seen by health care provider: Secondary | ICD-10-CM | POA: Insufficient documentation

## 2022-06-09 DIAGNOSIS — R200 Anesthesia of skin: Secondary | ICD-10-CM | POA: Insufficient documentation

## 2022-06-09 NOTE — ED Notes (Signed)
Patient said she feels like her blood pressure is coming down and she would rather go to urgent care in the morning instead of waiting.

## 2022-06-09 NOTE — ED Triage Notes (Signed)
Patient said for about a week now her left calf, arm, face has been feeling numb. After she takes aspirin the numbness goes away, but today it did not.

## 2022-06-10 ENCOUNTER — Other Ambulatory Visit: Payer: Self-pay

## 2022-06-10 ENCOUNTER — Emergency Department (HOSPITAL_COMMUNITY): Payer: BLUE CROSS/BLUE SHIELD

## 2022-06-10 ENCOUNTER — Emergency Department (HOSPITAL_COMMUNITY)
Admission: EM | Admit: 2022-06-10 | Discharge: 2022-06-10 | Disposition: A | Discharge status: Home / Self Care | Payer: BLUE CROSS/BLUE SHIELD | Attending: Emergency Medicine | Admitting: Emergency Medicine

## 2022-06-10 ENCOUNTER — Encounter (HOSPITAL_COMMUNITY): Payer: Self-pay

## 2022-06-10 ENCOUNTER — Ambulatory Visit
Admission: EM | Admit: 2022-06-10 | Discharge: 2022-06-10 | Disposition: A | Discharge status: Home / Self Care | Payer: BLUE CROSS/BLUE SHIELD | Attending: Internal Medicine | Admitting: Internal Medicine

## 2022-06-10 DIAGNOSIS — I639 Cerebral infarction, unspecified: Secondary | ICD-10-CM | POA: Insufficient documentation

## 2022-06-10 DIAGNOSIS — G459 Transient cerebral ischemic attack, unspecified: Secondary | ICD-10-CM | POA: Diagnosis not present

## 2022-06-10 DIAGNOSIS — Z79899 Other long term (current) drug therapy: Secondary | ICD-10-CM | POA: Diagnosis not present

## 2022-06-10 DIAGNOSIS — R200 Anesthesia of skin: Secondary | ICD-10-CM

## 2022-06-10 DIAGNOSIS — R531 Weakness: Secondary | ICD-10-CM | POA: Insufficient documentation

## 2022-06-10 DIAGNOSIS — R209 Unspecified disturbances of skin sensation: Secondary | ICD-10-CM | POA: Insufficient documentation

## 2022-06-10 LAB — CBC
HCT: 45.5 % (ref 36.0–46.0)
Hemoglobin: 16 g/dL — ABNORMAL HIGH (ref 12.0–15.0)
MCH: 32 pg (ref 26.0–34.0)
MCHC: 35.2 g/dL (ref 30.0–36.0)
MCV: 91 fL (ref 80.0–100.0)
Platelets: 412 10*3/uL — ABNORMAL HIGH (ref 150–400)
RBC: 5 MIL/uL (ref 3.87–5.11)
RDW: 13.2 % (ref 11.5–15.5)
WBC: 4.6 10*3/uL (ref 4.0–10.5)
nRBC: 0 % (ref 0.0–0.2)

## 2022-06-10 LAB — DIFFERENTIAL
Abs Immature Granulocytes: 0.01 10*3/uL (ref 0.00–0.07)
Basophils Absolute: 0 10*3/uL (ref 0.0–0.1)
Basophils Relative: 1 %
Eosinophils Absolute: 0.1 10*3/uL (ref 0.0–0.5)
Eosinophils Relative: 2 %
Immature Granulocytes: 0 %
Lymphocytes Relative: 34 %
Lymphs Abs: 1.6 10*3/uL (ref 0.7–4.0)
Monocytes Absolute: 0.3 10*3/uL (ref 0.1–1.0)
Monocytes Relative: 7 %
Neutro Abs: 2.6 10*3/uL (ref 1.7–7.7)
Neutrophils Relative %: 56 %

## 2022-06-10 LAB — COMPREHENSIVE METABOLIC PANEL
ALT: 21 U/L (ref 0–44)
AST: 30 U/L (ref 15–41)
Albumin: 3.8 g/dL (ref 3.5–5.0)
Alkaline Phosphatase: 41 U/L (ref 38–126)
Anion gap: 10 (ref 5–15)
BUN: 11 mg/dL (ref 8–23)
CO2: 26 mmol/L (ref 22–32)
Calcium: 9.4 mg/dL (ref 8.9–10.3)
Chloride: 105 mmol/L (ref 98–111)
Creatinine, Ser: 0.96 mg/dL (ref 0.44–1.00)
GFR, Estimated: >60 mL/min (ref >60)
Glucose, Bld: 100 mg/dL — ABNORMAL HIGH (ref 70–99)
Potassium: 3.5 mmol/L (ref 3.5–5.1)
Sodium: 141 mmol/L (ref 135–145)
Total Bilirubin: 0.7 mg/dL (ref 0.3–1.2)
Total Protein: 6.9 g/dL (ref 6.5–8.1)

## 2022-06-10 LAB — ETHANOL: Alcohol, Ethyl (B): <10 mg/dL (ref <10)

## 2022-06-10 LAB — I-STAT CHEM 8, ED
BUN: 9 mg/dL (ref 8–23)
Calcium, Ion: 1.16 mmol/L (ref 1.15–1.40)
Chloride: 102 mmol/L (ref 98–111)
Creatinine, Ser: 1 mg/dL (ref 0.44–1.00)
Glucose, Bld: 101 mg/dL — ABNORMAL HIGH (ref 70–99)
HCT: 45 % (ref 36.0–46.0)
Hemoglobin: 15.3 g/dL — ABNORMAL HIGH (ref 12.0–15.0)
Potassium: 3.5 mmol/L (ref 3.5–5.1)
Sodium: 139 mmol/L (ref 135–145)
TCO2: 25 mmol/L (ref 22–32)

## 2022-06-10 LAB — PROTIME-INR
INR: 0.9 (ref 0.8–1.2)
Prothrombin Time: 12 seconds (ref 11.4–15.2)

## 2022-06-10 LAB — APTT: aPTT: 26 seconds (ref 24–36)

## 2022-06-10 MED ORDER — SODIUM CHLORIDE 0.9% FLUSH
3.0000 mL | Freq: Once | INTRAVENOUS | Status: AC
  Administered 2022-06-10: 3 mL via INTRAVENOUS

## 2022-06-10 NOTE — ED Provider Triage Note (Addendum)
Emergency Medicine Provider Triage Evaluation Note  Teresa Cabrera , a 61 y.o. female  was evaluated in triage.  Pt complains of L sided numbness for about a week. When it started last week patient took an aspirin and symptoms resolved. Did that for two more days and kept symptoms at [place]. Over the weekend, been taking aspirin with no relief. 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 Came here the other day, left due to wait times. Went to UC, and they sent her back here for stroke vs TIA workup.   Review of Systems  Positive: L sided numbness Negative: Headache, blurry vision, weakness, slurred speech  Physical Exam  BP 136/88 (BP Location: Left Arm)   Pulse 69   Temp 98 F (36.7 C) (Oral)   Resp 18   Ht '5\' 5"'$  (1.651 m)   Wt 67.9 kg   SpO2 99%   BMI 24.89 kg/m  Gen:   Awake, no distress   Resp:  Normal effort  MSK:   Moves extremities without difficulty  Other:  No facial droop, no slurred speech  Medical Decision Making  Medically screening exam initiated at 10:12 AM.  Appropriate orders placed.  Teresa Cabrera was informed that the remainder of the evaluation will be completed by another provider, this initial triage assessment does not replace that evaluation, and the importance of remaining in the ED until their evaluation is complete.  10:16am Patient does NOT meet code stroke criteria. Order corrected and Network engineer notified.   12:00pm  Verified patient's reassuring labs and CT scan. Concern for TIA, will order MRI brain.    Leona Pressly T, PA-C 06/10/22 1159

## 2022-06-10 NOTE — ED Triage Notes (Signed)
Pt c/o left side-ed numbness for ~ 1 week. Concerned for stroke. States has been taking her HTN med as prescribed. States when it started it lasted ~ 1 hour and returned to normal. But since it has returned the sensation has not ceased.

## 2022-06-10 NOTE — Discharge Instructions (Addendum)
You have been seen and discharged from the emergency department.  Your blood work was normal.  CT of the head and MRI of the head was normal, no findings of stroke.  Follow-up with your primary provider and neurology for further evaluation and further care. Take home medications as prescribed. If you have any worsening symptoms or further concerns for your health please return to an emergency department for further evaluation.

## 2022-06-10 NOTE — Discharge Instructions (Addendum)
Please go to the emergency department for further evaluation.

## 2022-06-10 NOTE — ED Provider Notes (Signed)
[facility]    CSN: [id number] Arrival date & time: 06/10/22  0859      History   Chief Complaint Chief Complaint  Patient presents with   left-sided numbness    HPI Teresa Cabrera is a 61 y.o. female was to the urgent care with a 1 week history of recurrent left facial and left-sided numbness.  Patient says symptoms started spontaneously and resolved spontaneously.  Over the past few days she is had numbness to the left side with no weakness on the left side.  The numbness seem to be persistent.  No difficulty finding her words.  No difficulty swallowing.  No headaches.  No palpitations.  No falls or trauma.  Patient has a history of hypertension and is compliant with her medications.  No family history of TIA or stroke.   HPI  Past Medical History:  Diagnosis Date   Anemia    Anxiety    Depression    GERD (gastroesophageal reflux disease)    Hyperlipidemia    Hypertension    Insomnia    Substance abuse ([place])     Patient Active Problem List   Diagnosis Date Noted   Encounter for IUD removal 12/28/2019   Abnormal mammogram of right breast 06/10/2016   Family history of breast cancer in sister 06/10/2016   Nonspecific immunological findings 02/04/2014   Hypercholesterolemia 01/18/2014   Vitamin D deficiency 01/18/2014   Nondependent alcohol abuse 02/02/2013   Essential hypertension 02/02/2013   DUB (dysfunctional uterine bleeding) 04/09/2012   Alcohol abuse 04/09/2012   Smoker 04/09/2012   HTN (hypertension) 04/09/2012   Active smoker 04/09/2012   Dysfunctional uterine bleeding 04/09/2012   Current smoker 04/09/2012   Insomnia     Past Surgical History:  Procedure Laterality Date   TUBAL LIGATION      OB History     Gravida  5   Para  2   Term      Preterm      AB  3   Living  2      SAB      IAB      Ectopic      Multiple      Live Births               Home Medications    Prior to Admission medications    Medication Sig Start Date End Date Taking? Authorizing Provider  atenolol-chlorthalidone (TENORETIC) 50-25 MG per tablet Take 1 tablet by mouth at bedtime. 08/08/11   [provider]  KLOR-CON M20 20 MEQ tablet Take 20 mEq by mouth 2 (two) times daily. 03/07/22   [provider]  Multiple Vitamin (MULTIVITAMIN PO) Take by mouth daily. Hair,nail,collagen take 4 pills    [provider]  naproxen (NAPROSYN) 500 MG tablet Take 1 tablet (500 mg total) by mouth 2 (two) times daily. Patient taking differently: Take 500 mg by mouth as needed. 12/29/15   Dowless, Aldona Bar Tripp, PA-C  omeprazole (PRILOSEC) 40 MG capsule Take 1 tablet by mouth daily. 05/16/17   [provider]  omeprazole (PRILOSEC) 40 MG capsule Take 1 capsule (40 mg total) by mouth daily. 02/01/22   Dorna Mai, MD  pravastatin (PRAVACHOL) 20 MG tablet every evening. Patient not taking: Reported on 06/03/2022 05/07/17   [provider]  pravastatin (PRAVACHOL) 20 MG tablet Take 20 mg by mouth daily.    [provider]  traZODone (DESYREL) 100 MG tablet Take 1 tablet (100 mg total) by  mouth at bedtime. Patient taking differently: Take 100 mg by mouth as needed. 02/26/22   Dorna Mai, MD    Family History Family History  Problem Relation Age of Onset   Other Mother        lou gehrigs disease   Breast cancer Sister    Aneurysm Sister    Cancer Paternal Grandmother    Colon cancer Neg Hx    Colon polyps Neg Hx    Crohn's disease Neg Hx    Esophageal cancer Neg Hx    Rectal cancer Neg Hx    Stomach cancer Neg Hx     Social History Social History   Tobacco Use   Smoking status: Some Days    Packs/day: 0.25    Types: Cigarettes    Passive exposure: Current   Smokeless tobacco: Never  Vaping Use   Vaping Use: Never used  Substance Use Topics   Alcohol use: Yes    Alcohol/week: 16.0 standard drinks of alcohol    Types: 16 Standard drinks or equivalent per week     Comment: 1/2 pt. daily   Drug use: Not Currently    Types: Marijuana     Allergies   Alprazolam, Alprazolam, and Crestor [rosuvastatin calcium]   Review of Systems Review of Systems  Respiratory: Negative.    Cardiovascular: Negative.   Gastrointestinal: Negative.   Genitourinary: Negative.   Musculoskeletal: Negative.   Neurological:  Positive for numbness.  Psychiatric/Behavioral:  Negative for confusion.      Physical Exam Triage Vital Signs ED Triage Vitals  Enc Vitals Group     BP 06/10/22 0909 (!) 141/84     Pulse Rate 06/10/22 0909 70     Resp 06/10/22 0909 18     Temp 06/10/22 0909 98 F (36.7 C)     Temp Source 06/10/22 0909 Oral     SpO2 06/10/22 0909 98 %     Weight --      Height --      Head Circumference --      Peak Flow --      Pain Score 06/10/22 0910 0     Pain Loc --      Pain Edu? --      Excl. in [place]? --    No data found.  Updated Vital Signs BP (!) 141/84 (BP Location: Left Arm)   Pulse 70   Temp 98 F (36.7 C) (Oral)   Resp 18   SpO2 98%   Visual Acuity Right Eye Distance:   Left Eye Distance:   Bilateral Distance:    Right Eye Near:   Left Eye Near:    Bilateral Near:     Physical Exam Vitals and nursing note reviewed.  Constitutional:      Appearance: Normal appearance.  Cardiovascular:     Rate and Rhythm: Normal rate and regular rhythm.  Musculoskeletal:        General: No swelling, tenderness or signs of injury. Normal range of motion.  Skin:    General: Skin is warm.     Findings: No bruising or erythema.  Neurological:     Mental Status: She is alert.     Sensory: Sensory deficit present.     Comments: Left-sided sensory deficit.  Power is 5/5 in all extremities.PERRLA, EOMI.  Deep tendon reflexes are 2+ in all extremities.  No facial deviation.  Tongue is midline on protrusion of the tongue.  Patient is able to shrug his shoulders with normal strength.  Negative pronator drift.      UC Treatments / Results   Labs (all labs ordered are listed, but only abnormal results are displayed) Labs Reviewed - No data to display  EKG   Radiology No results found.  Procedures Procedures (including critical care time)  Medications Ordered in UC Medications - No data to display  Initial Impression / Assessment and Plan / UC Course  I have reviewed the triage vital signs and the nursing notes.  Pertinent labs & imaging results that were available during my care of the patient were reviewed by me and considered in my medical decision making (see chart for details).     1.  Presumed transient ischemic attack: Patient is advised to go to the emergency department for further evaluation.  She will need further testing. Patient is agreeable to going to the ED. Final Clinical Impressions(s) / UC Diagnoses   Final diagnoses:  TIA (transient ischemic attack)     Discharge Instructions      Please go to the emergency department for further evaluation.   ED Prescriptions   None    PDMP not reviewed this encounter.   Chase Picket, MD 06/10/22 1010

## 2022-06-10 NOTE — ED Triage Notes (Signed)
Patient c/o left sided numbness x 1 week. Patient states she was in the ED last night and left  then she went to an UC today and was instructed to come back to he ED for a CT Scan.

## 2022-06-10 NOTE — ED Provider Notes (Signed)
[facility] DEPT Provider Note   CSN: [id number] Arrival date & time: 06/10/22  0955     History  Chief Complaint  Patient presents with   Numbness    Mallika Sanmiguel is a 61 y.o. female.  HPI  61 year old female presents emergency department from urgent care with concern for rule out TIA/CVA.  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  Over the last couple weeks to months she has been having intermittent numbness/tingling of her left lower extremity that she describes as extending from her hip down to her foot.  She states that this would be intermittent, self resolve, improved with aspirin.  Over the last week this has also been involving her left upper extremity and left face.  Initially improved with doses of aspirin but now feels persistent.  She has no left-sided weakness.  Denies any facial droop, vision change, speech changes.  She denies any head or neck pain.  Home Medications Prior to Admission medications   Medication Sig Start Date End Date Taking? Authorizing Provider  atenolol-chlorthalidone (TENORETIC) 50-25 MG per tablet Take 1 tablet by mouth at bedtime. 08/08/11   [provider]  KLOR-CON M20 20 MEQ tablet Take 20 mEq by mouth 2 (two) times daily. 03/07/22   [provider]  Multiple Vitamin (MULTIVITAMIN PO) Take by mouth daily. Hair,nail,collagen take 4 pills    [provider]  naproxen (NAPROSYN) 500 MG tablet Take 1 tablet (500 mg total) by mouth 2 (two) times daily. Patient taking differently: Take 500 mg by mouth as needed. 12/29/15   Dowless, Aldona Bar Tripp, PA-C  omeprazole (PRILOSEC) 40 MG capsule Take 1 tablet by mouth daily. 05/16/17   [provider]  omeprazole (PRILOSEC) 40 MG capsule Take 1 capsule (40 mg total) by mouth daily. 02/01/22   Dorna Mai, MD  pravastatin (PRAVACHOL) 20 MG tablet every evening. Patient not taking: Reported  on 06/03/2022 05/07/17   [provider]  pravastatin (PRAVACHOL) 20 MG tablet Take 20 mg by mouth daily.    [provider]  traZODone (DESYREL) 100 MG tablet Take 1 tablet (100 mg total) by mouth at bedtime. Patient taking differently: Take 100 mg by mouth as needed. 02/26/22   Dorna Mai, MD      Allergies    Alprazolam, Alprazolam, and Crestor [rosuvastatin calcium]    Review of Systems   Review of Systems  Constitutional:  Negative for fatigue.  Cardiovascular:  Negative for chest pain.  Gastrointestinal:  Negative for nausea.  Musculoskeletal:  Negative for back pain.  Neurological:  Positive for numbness. Negative for dizziness, tremors, facial asymmetry, speech difficulty, weakness and headaches.    Physical Exam Updated Vital Signs BP 116/78   Pulse 66   Temp 98 F (36.7 C) (Oral)   Resp 19   Ht '5\' 5"'$  (1.651 m)   Wt 67.9 kg   SpO2 100%   BMI 24.89 kg/m  Physical Exam Vitals and nursing note reviewed.  Constitutional:      General: She is not in acute distress.    Appearance: Normal appearance. She is not ill-appearing.  HENT:     Head: Normocephalic.     Mouth/Throat:     Mouth: Mucous membranes are moist.  Cardiovascular:     Rate and Rhythm: Normal rate.  Pulmonary:     Effort: Pulmonary effort is normal. No respiratory distress.  Abdominal:     Palpations: Abdomen is soft.  Tenderness: There is no abdominal tenderness.  Skin:    General: Skin is warm.  Neurological:     General: No focal deficit present.     Mental Status: She is alert and oriented to person, place, and time. Mental status is at baseline.     Cranial Nerves: No cranial nerve deficit.     Motor: No weakness.     Coordination: Coordination normal.     Comments: Subjective decreased sensation involving LLE, LUE, left face. No facial droop or extremity drift.     ED Results / Procedures / Treatments   Labs (all labs ordered are listed, but only abnormal results  are displayed) Labs Reviewed  CBC - Abnormal; Notable for the following components:      Result Value   Hemoglobin 16.0 (*)    Platelets 412 (*)    All other components within normal limits  COMPREHENSIVE METABOLIC PANEL - Abnormal; Notable for the following components:   Glucose, Bld 100 (*)    All other components within normal limits  I-STAT CHEM 8, ED - Abnormal; Notable for the following components:   Glucose, Bld 101 (*)    Hemoglobin 15.3 (*)    All other components within normal limits  PROTIME-INR  APTT  DIFFERENTIAL  ETHANOL  CBG MONITORING, ED    EKG None  Radiology MR BRAIN WO CONTRAST  Result Date: 06/10/2022 CLINICAL DATA:  TIA EXAM: MRI HEAD WITHOUT CONTRAST TECHNIQUE: Multiplanar, multiecho pulse sequences of the brain and surrounding structures were obtained without intravenous contrast. COMPARISON:  None Available. FINDINGS: Brain: There is no acute infarction or intracranial hemorrhage. There is no intracranial mass, mass effect, or edema. There is no hydrocephalus or extra-axial fluid collection. Ventricles and sulci are normal in size and configuration. Vascular: Major vessel flow voids at the skull base are preserved. Skull and upper cervical spine: Normal marrow signal is preserved. Sinuses/Orbits: Paranasal sinuses are aerated. Orbits are unremarkable. Other: Sella is unremarkable.  Mastoid air cells are clear. IMPRESSION: No evidence of recent infarction, hemorrhage, or mass. Electronically Signed   By: Macy Mis M.D.   On: 06/10/2022 13:16   CT HEAD WO CONTRAST  Result Date: 06/10/2022 CLINICAL DATA:  Left-sided numbness.  Transient ischemic attack. EXAM: CT HEAD WITHOUT CONTRAST TECHNIQUE: Contiguous axial images were obtained from the base of the skull through the vertex without intravenous contrast. RADIATION DOSE REDUCTION: This exam was performed according to the departmental dose-optimization program which includes automated exposure control,  adjustment of the mA and/or kV according to patient size and/or use of iterative reconstruction technique. COMPARISON:  None Available. FINDINGS: Brain: No evidence of acute infarction, hemorrhage, hydrocephalus, extra-axial collection or mass lesion/mass effect. Vascular: No hyperdense vessel or unexpected calcification. Skull: Normal. Negative for fracture or focal lesion. Sinuses/Orbits: No acute finding. Other: None. IMPRESSION: No acute intracranial abnormality seen. Electronically Signed   By: Marijo Conception M.D.   On: 06/10/2022 10:42    Procedures Procedures    Medications Ordered in ED Medications  sodium chloride flush (NS) 0.9 % injection 3 mL (3 mLs Intravenous Given 06/10/22 1030)    ED Course/ Medical Decision Making/ A&P                           Medical Decision Making  61 year old female presents emergency department with left-sided numbness.  This initially started, intermittently in the left lower extremity.  Over the past week has been involving the left  upper extremity and face.  Initially intermittent, improving with aspirin.  Now feels more persistent.  No associated weakness, head pain, facial droop, vision changes, speech changes or other acute neurodeficit.  Vital signs are stable here.  She has subjective decrease sensation involving the left side of the body but no weakness or drift.  Cranial nerves intact, steady gait.  Blood work is normal.  CT of the head and MRI of the head showed no acute finding.  No CVA.  Doubt TIA given the persistent symptoms.  Does not seem to be a pattern consistent with MS.  Unclear what is causing her symptoms but no indication for emergent admission/neurology consult at this time.  She is otherwise fully functional.  We will plan for outpatient follow-up.  Patient at this time appears safe and stable for discharge and close outpatient follow up. Discharge plan and strict return to ED precautions discussed, patient verbalizes understanding  and agreement.        Final Clinical Impression(s) / ED Diagnoses Final diagnoses:  Left sided numbness    Rx / DC Orders ED Discharge Orders     None         Lorelle Gibbs, DO 06/10/22 2004

## 2022-06-20 ENCOUNTER — Encounter: Payer: Self-pay | Admitting: Gastroenterology

## 2022-07-01 ENCOUNTER — Encounter: Payer: Self-pay | Admitting: Gastroenterology

## 2022-07-01 ENCOUNTER — Ambulatory Visit (AMBULATORY_SURGERY_CENTER): Payer: BLUE CROSS/BLUE SHIELD | Admitting: Gastroenterology

## 2022-07-01 VITALS — BP 137/69 | HR 65 | Temp 97.5°F | Resp 10 | Ht 65.0 in | Wt 149.6 lb

## 2022-07-01 DIAGNOSIS — D123 Benign neoplasm of transverse colon: Secondary | ICD-10-CM

## 2022-07-01 DIAGNOSIS — Z1211 Encounter for screening for malignant neoplasm of colon: Secondary | ICD-10-CM | POA: Diagnosis present

## 2022-07-01 DIAGNOSIS — D124 Benign neoplasm of descending colon: Secondary | ICD-10-CM

## 2022-07-01 MED ORDER — SODIUM CHLORIDE 0.9 % IV SOLN: 500.0000 mL | Freq: Once | INTRAVENOUS | Status: DC

## 2022-07-01 NOTE — Op Note (Signed)
[facility] Patient Name: Teresa Cabrera Procedure Date: 07/01/2022 8:11 AM MRN: [id number] Endoscopist: Ladene Artist , MD Age: 61 Referring MD:  Date of Birth: 1961/12/13 Gender: Female Account #: [id number] Procedure:                Colonoscopy Indications:              Screening for colorectal malignant neoplasm Medicines:                Monitored Anesthesia Care Procedure:                Pre-Anesthesia Assessment:                           - Prior to the procedure, a History and Physical                            was performed, and patient medications and                            allergies were reviewed. The patient's tolerance of                            previous anesthesia was also reviewed. The risks                            and benefits of the procedure and the sedation                            options and risks were discussed with the patient.                            All questions were answered, and informed consent                            was obtained. Prior Anticoagulants: The patient has                            taken no previous anticoagulant or antiplatelet                            agents. ASA Grade Assessment: II - A patient with                            mild systemic disease. After reviewing the risks                            and benefits, the patient was deemed in                            satisfactory condition to undergo the procedure.                           After obtaining informed consent, the colonoscope  was passed under direct vision. Throughout the                            procedure, the patient's blood pressure, pulse, and                            oxygen saturations were monitored continuously. The                            CF [id number] #[id number] was introduced through the anus                            and advanced to the the cecum, identified by                            appendiceal orifice  and ileocecal valve. The                            ileocecal valve, appendiceal orifice, and rectum                            were photographed. The quality of the bowel                            preparation was good. The colonoscopy was performed                            without difficulty. The patient tolerated the                            procedure well. Scope In: 8:18:11 AM Scope Out: 8:30:59 AM Scope Withdrawal Time: 0 hours 9 minutes 50 seconds  Total Procedure Duration: 0 hours 12 minutes 48 seconds  Findings:                 The perianal and digital rectal examinations were                            normal.                           Two pedunculated and sessile polyps were found in                            the descending colon and transverse colon. The                            polyps were 7 to 9 mm in size. These polyps were                            removed with a cold snare. Resection and retrieval                            were complete.  Multiple medium-mouthed diverticula were found in                            the sigmoid colon, descending colon and transverse                            colon. There was no evidence of diverticular                            bleeding.                           The exam was otherwise without abnormality on                            direct and retroflexion views. Complications:            No immediate complications. Estimated blood loss:                            None. Estimated Blood Loss:     Estimated blood loss: none. Impression:               - Two 7 to 9 mm polyps in the descending colon and                            in the transverse colon, removed with a cold snare.                            Resected and retrieved.                           - Moderate diverticulosis in the sigmoid colon, in                            the descending colon and in the transverse colon.                            - The examination was otherwise normal on direct                            and retroflexion views. Recommendation:           - Repeat colonoscopy date to be determined after                            pending pathology results are reviewed for                            surveillance of multiple polyps.                           - Patient has a contact number available for                            emergencies. The signs and symptoms of  potential                            delayed complications were discussed with the                            patient. Return to normal activities tomorrow.                            Written discharge instructions were provided to the                            patient.                           - High fiber diet.                           - Continue present medications.                           - Await pathology results. Ladene Artist, MD 07/01/2022 8:34:35 AM This report has been signed electronically.

## 2022-07-01 NOTE — Progress Notes (Signed)
See 06/10/2022 H&P, no changes

## 2022-07-01 NOTE — Progress Notes (Signed)
Report to PACU, RN, vss, BBS= Clear.  

## 2022-07-01 NOTE — Patient Instructions (Signed)
  HANDOUTS ON POLYPS AND DIVERTICULOSIS GIVEN.  HIGH FIBER DIET.    YOU HAD AN ENDOSCOPIC PROCEDURE TODAY AT [facility]:   Refer to the procedure report that was given to you for any specific questions about what was found during the examination.  If the procedure report does not answer your questions, please call your gastroenterologist to clarify.  If you requested that your care partner not be given the details of your procedure findings, then the procedure report has been included in a sealed envelope for you to review at your convenience later.  YOU SHOULD EXPECT: Some feelings of bloating in the abdomen. Passage of more gas than usual.  Walking can help get rid of the air that was put into your GI tract during the procedure and reduce the bloating. If you had a lower endoscopy (such as a colonoscopy or flexible sigmoidoscopy) you may notice spotting of blood in your stool or on the toilet paper. If you underwent a bowel prep for your procedure, you may not have a normal bowel movement for a few days.  Please Note:  You might notice some irritation and congestion in your nose or some drainage.  This is from the oxygen used during your procedure.  There is no need for concern and it should clear up in a day or so.  SYMPTOMS TO REPORT IMMEDIATELY:  Following lower endoscopy (colonoscopy or flexible sigmoidoscopy):  Excessive amounts of blood in the stool  Significant tenderness or worsening of abdominal pains  Swelling of the abdomen that is new, acute  Fever of 100F or higher   For urgent or emergent issues, a gastroenterologist can be reached at any hour by calling [phone]. Do not use MyChart messaging for urgent concerns.    DIET:  We do recommend a small meal at first, but then you may proceed to your regular diet.  Drink plenty of fluids but you should avoid alcoholic beverages for 24 hours.  ACTIVITY:  You should plan to take it easy for the rest of today  and you should NOT DRIVE or use heavy machinery until tomorrow (because of the sedation medicines used during the test).    FOLLOW UP: Our staff will call the number listed on your records the next business day following your procedure.  We will call around 7:15- 8:00 am to check on you and address any questions or concerns that you may have regarding the information given to you following your procedure. If we do not reach you, we will leave a message.  If you develop any symptoms (ie: fever, flu-like symptoms, shortness of breath, cough etc.) before then, please call [phone].  If you test positive for Covid 19 in the 2 weeks post procedure, please call and report this information to [place].    If any biopsies were taken you will be contacted by phone or by letter within the next 1-3 weeks.  Please call us at [phone] if you have not heard about the biopsies in 3 weeks.    SIGNATURES/CONFIDENTIALITY: You and/or your care partner have signed paperwork which will be entered into your electronic medical record.  These signatures attest to the fact that that the information above on your After Visit Summary has been reviewed and is understood.  Full responsibility of the confidentiality of this discharge information lies with you and/or your care-partner.

## 2022-07-01 NOTE — Progress Notes (Signed)
Medical record updated 

## 2022-07-02 ENCOUNTER — Telehealth: Payer: Self-pay | Admitting: *Deleted

## 2022-07-02 NOTE — Telephone Encounter (Signed)
  Follow up Call-     07/01/2022    7:35 AM  Call back number  Post procedure Call Back phone  # [phone]  Permission to leave phone message Yes     Patient questions:  Do you have a fever, pain , or abdominal swelling? No. Pain Score  0 *  Have you tolerated food without any problems? Yes.    Have you been able to return to your normal activities? Yes.    Do you have any questions about your discharge instructions: Diet   No. Medications  No. Follow up visit  No.  Do you have questions or concerns about your Care? No.  Actions: * If pain score is 4 or above: No action needed, pain <4.

## 2022-07-16 ENCOUNTER — Encounter: Payer: Self-pay | Admitting: Gastroenterology

## 2022-07-30 ENCOUNTER — Other Ambulatory Visit: Payer: Self-pay | Admitting: Gastroenterology

## 2022-07-30 DIAGNOSIS — Z1211 Encounter for screening for malignant neoplasm of colon: Secondary | ICD-10-CM

## 2022-08-14 ENCOUNTER — Telehealth: Payer: Self-pay | Admitting: Family Medicine

## 2022-08-14 NOTE — Telephone Encounter (Signed)
Urgent refill request for  omeprazole (PRILOSEC) 40 MG capsule [[id number]]   Pharmacy  [id number] [address] [id number] Phone:  [phone] Fax:  [phone] DEA #:  [id number]

## 2022-08-15 ENCOUNTER — Other Ambulatory Visit: Payer: Self-pay | Admitting: *Deleted

## 2022-08-15 ENCOUNTER — Telehealth: Payer: Self-pay | Admitting: Family Medicine

## 2022-08-15 MED ORDER — OMEPRAZOLE 40 MG PO CPDR: 40.0000 mg | DELAYED_RELEASE_CAPSULE | Freq: Every day | ORAL | 0 refills | Status: DC

## 2022-08-15 NOTE — Telephone Encounter (Signed)
Medication refilled

## 2022-08-15 NOTE — Telephone Encounter (Signed)
Came in again checking on status of omeprazole asap please needs before Saturday.

## 2022-10-10 ENCOUNTER — Ambulatory Visit: Payer: BLUE CROSS/BLUE SHIELD | Admitting: Family Medicine

## 2022-10-10 ENCOUNTER — Encounter: Payer: Self-pay | Admitting: Family Medicine

## 2022-10-10 VITALS — BP 126/84 | HR 68 | Temp 98.1°F | Resp 16 | Wt 143.0 lb

## 2022-10-10 DIAGNOSIS — Z23 Encounter for immunization: Secondary | ICD-10-CM | POA: Diagnosis not present

## 2022-10-10 DIAGNOSIS — G4700 Insomnia, unspecified: Secondary | ICD-10-CM | POA: Diagnosis not present

## 2022-10-10 MED ORDER — TRAZODONE HCL 150 MG PO TABS: 150.0000 mg | ORAL_TABLET | Freq: Every day | ORAL | 0 refills | Status: DC

## 2022-10-10 NOTE — Progress Notes (Signed)
Patient is here for c/o needing something to help her sleep through the night . Patient request Ambien.

## 2022-10-14 ENCOUNTER — Encounter: Payer: Self-pay | Admitting: Family Medicine

## 2022-10-14 NOTE — Progress Notes (Signed)
Established Patient Office Visit  Subjective    Patient ID: Teresa Cabrera, female    DOB: 1961/02/29  Age: 61 y.o. MRN: [id number]  CC:  Chief Complaint  Patient presents with   Insomnia    HPI Teresa Cabrera presents for follow up of insomnia. She reports that she would like to try a higher dose of trazodone.    Outpatient Encounter Medications as of 10/10/2022  Medication Sig   aspirin EC 81 MG tablet Take 81 mg by mouth daily. Swallow whole.   atenolol-chlorthalidone (TENORETIC) 50-25 MG per tablet Take 1 tablet by mouth at bedtime.   Biotin 1000 MCG CHEW Chew by mouth.   KLOR-CON M20 20 MEQ tablet Take 20 mEq by mouth 2 (two) times daily.   naproxen (NAPROSYN) 500 MG tablet Take 1 tablet (500 mg total) by mouth 2 (two) times daily. (Patient taking differently: Take 500 mg by mouth as needed.)   omeprazole (PRILOSEC) 40 MG capsule Take 1 capsule (40 mg total) by mouth daily.   pravastatin (PRAVACHOL) 20 MG tablet Take 20 mg by mouth daily.   traZODone (DESYREL) 150 MG tablet Take 1 tablet (150 mg total) by mouth at bedtime.   [DISCONTINUED] traZODone (DESYREL) 100 MG tablet Take 1 tablet (100 mg total) by mouth at bedtime. (Patient taking differently: Take 100 mg by mouth as needed.)   fluconazole (DIFLUCAN) 150 MG tablet Take 150 mg by mouth once. (Patient not taking: Reported on 07/01/2022)   Multiple Vitamin (MULTIVITAMIN PO) Take by mouth daily. Hair,nail,collagen take 4 pills (Patient not taking: Reported on 07/01/2022)   No facility-administered encounter medications on file as of 10/10/2022.    Past Medical History:  Diagnosis Date   Anemia    Anxiety    Depression    GERD (gastroesophageal reflux disease)    Hyperlipidemia    Hypertension    Insomnia    Substance abuse ([place])     Past Surgical History:  Procedure Laterality Date   COLONOSCOPY  2012   TUBAL LIGATION      Family History  Problem Relation Age of Onset   Other Mother        lou gehrigs  disease   Breast cancer Sister    Aneurysm Sister    Cancer Paternal Grandmother    Colon cancer Neg Hx    Colon polyps Neg Hx    Crohn's disease Neg Hx    Esophageal cancer Neg Hx    Rectal cancer Neg Hx    Stomach cancer Neg Hx     Social History   Socioeconomic History   Marital status: Married    Spouse name: Not on file   Number of children: Not on file   Years of education: Not on file   Highest education level: Not on file  Occupational History   Not on file  Tobacco Use   Smoking status: Some Days    Packs/day: 0.25    Types: Cigarettes    Passive exposure: Current   Smokeless tobacco: Never   Tobacco comments:    No smoking today 07/01/22  Vaping Use   Vaping Use: Never used  Substance and Sexual Activity   Alcohol use: Yes    Alcohol/week: 16.0 standard drinks of alcohol    Types: 16 Standard drinks or equivalent per week    Comment: 1/2 pt. daily   Drug use: Not Currently    Types: Marijuana   Sexual activity: Not on file  Other Topics Concern  Not on file  Social History Narrative   Not on file   Social Determinants of Health   Financial Resource Strain: Not on file  Food Insecurity: Not on file  Transportation Needs: Not on file  Physical Activity: Not on file  Stress: Not on file  Social Connections: Not on file  Intimate Partner Violence: Not on file    Review of Systems  Psychiatric/Behavioral:  The patient has insomnia.   All other systems reviewed and are negative.       Objective    BP 126/84   Pulse 68   Temp 98.1 F (36.7 C) (Oral)   Resp 16   Wt 143 lb (64.9 kg)   SpO2 96%   BMI 23.80 kg/m   Physical Exam Vitals and nursing note reviewed.  Constitutional:      General: She is not in acute distress. Cardiovascular:     Rate and Rhythm: Normal rate and regular rhythm.  Pulmonary:     Effort: Pulmonary effort is normal.     Breath sounds: Normal breath sounds.  Neurological:     General: No focal deficit present.      Mental Status: She is alert and oriented to person, place, and time.         Assessment & Plan:   1. Insomnia, unspecified type Improved but will increase trazodone from 100 to 150 mg at hs  2. Need for vaccination  - Varicella-zoster vaccine IM - Flu Vaccine QUAD 70moIM (Fluarix, Fluzone & Alfiuria Quad PF)    No follow-ups on file.   WBecky Sax MD

## 2022-11-05 ENCOUNTER — Encounter: Payer: Self-pay | Admitting: Surgical

## 2022-11-11 ENCOUNTER — Other Ambulatory Visit: Payer: Self-pay | Admitting: Family Medicine

## 2022-11-12 ENCOUNTER — Encounter: Payer: Self-pay | Admitting: Surgical

## 2022-11-23 ENCOUNTER — Other Ambulatory Visit: Payer: Self-pay | Admitting: Family Medicine

## 2022-11-26 ENCOUNTER — Other Ambulatory Visit: Payer: Self-pay | Admitting: Family Medicine

## 2022-11-26 ENCOUNTER — Ambulatory Visit: Payer: BLUE CROSS/BLUE SHIELD | Admitting: Family Medicine

## 2022-11-26 NOTE — Telephone Encounter (Signed)
Requested medication (s) are due for refill today - provider review   Requested medication (s) are on the active medication list - yes  Future visit scheduled -no  Last refill: 06/03/22  Notes to clinic: medications are listed as historical- provider has to review- patient is current with appointment- she is leaving on vacation tomorrow   Requested Prescriptions  Pending Prescriptions Disp Refills   atenolol-chlorthalidone (TENORETIC) 50-25 MG tablet      Sig: Take 1 tablet by mouth at bedtime.     Cardiovascular: Beta Blocker + Diuretic Combos Passed - 11/26/2022  3:24 PM      Passed - K in normal range and within 180 days    Potassium  Date Value Ref Range Status  06/10/2022 3.5 3.5 - 5.1 mmol/L Final         Passed - Na in normal range and within 180 days    Sodium  Date Value Ref Range Status  06/10/2022 139 135 - 145 mmol/L Final  01/29/2022 141 134 - 144 mmol/L Final         Passed - Cr in normal range and within 180 days    Creatinine, Ser  Date Value Ref Range Status  06/10/2022 1.00 0.44 - 1.00 mg/dL Final         Passed - eGFR in normal range and within 180 days    GFR, Estimated  Date Value Ref Range Status  06/10/2022 >60 >60 mL/min Final    Comment:    (NOTE) Calculated using the CKD-EPI Creatinine Equation (2021)    eGFR  Date Value Ref Range Status  01/29/2022 64 >59 mL/min/1.73 Final         Passed - Last BP in normal range    BP Readings from Last 1 Encounters:  10/10/22 126/84         Passed - Last Heart Rate in normal range    Pulse Readings from Last 1 Encounters:  10/10/22 68         Passed - Valid encounter within last 6 months    Recent Outpatient Visits           1 month ago Insomnia, unspecified type   [facility] at [facility], MD   9 months ago Pap smear for cervical cancer screening   Primary Care at [facility], [place]   10 months ago Annual physical exam   Primary Care at [facility], MD   1 year ago Encounter to establish care   Primary Care at [facility], Bayard Beaver, MD               pravastatin (PRAVACHOL) 20 MG tablet      Sig: Take 1 tablet (20 mg total) by mouth daily.     Cardiovascular:  Antilipid - Statins Failed - 11/26/2022  3:24 PM      Failed - Lipid Panel in normal range within the last 12 months    Cholesterol, Total  Date Value Ref Range Status  01/29/2022 216 (H) 100 - 199 mg/dL Final   LDL Chol Calc (NIH)  Date Value Ref Range Status  01/29/2022 97 0 - 99 mg/dL Final   HDL  Date Value Ref Range Status  01/29/2022 75 >39 mg/dL Final   Triglycerides  Date Value Ref Range Status  01/29/2022 263 (H) 0 - 149 mg/dL Final         Passed - Patient is not pregnant  Passed - Valid encounter within last 12 months    Recent Outpatient Visits           1 month ago Insomnia, unspecified type   Primary Care at [facility], Clyde Canterbury, MD   9 months ago Pap smear for cervical cancer screening   Primary Care at [place], [place]   10 months ago Annual physical exam   Primary Care at [facility], MD   1 year ago Encounter to establish care   Primary Care at [facility], Bayard Beaver, MD                 Requested Prescriptions  Pending Prescriptions Disp Refills   atenolol-chlorthalidone (TENORETIC) 50-25 MG tablet      Sig: Take 1 tablet by mouth at bedtime.     Cardiovascular: Beta Blocker + Diuretic Combos Passed - 11/26/2022  3:24 PM      Passed - K in normal range and within 180 days    Potassium  Date Value Ref Range Status  06/10/2022 3.5 3.5 - 5.1 mmol/L Final         Passed - Na in normal range and within 180 days    Sodium  Date Value Ref Range Status  06/10/2022 139 135 - 145 mmol/L Final  01/29/2022 141 134 - 144 mmol/L Final         Passed - Cr in normal range and within 180 days    Creatinine, Ser  Date Value Ref  Range Status  06/10/2022 1.00 0.44 - 1.00 mg/dL Final         Passed - eGFR in normal range and within 180 days    GFR, Estimated  Date Value Ref Range Status  06/10/2022 >60 >60 mL/min Final    Comment:    (NOTE) Calculated using the CKD-EPI Creatinine Equation (2021)    eGFR  Date Value Ref Range Status  01/29/2022 64 >59 mL/min/1.73 Final         Passed - Last BP in normal range    BP Readings from Last 1 Encounters:  10/10/22 126/84         Passed - Last Heart Rate in normal range    Pulse Readings from Last 1 Encounters:  10/10/22 68         Passed - Valid encounter within last 6 months    Recent Outpatient Visits           1 month ago Insomnia, unspecified type   Primary Care at [facility], MD   9 months ago Pap smear for cervical cancer screening   Primary Care at [facility], MD   10 months ago Annual physical exam   Primary Care at [facility], MD   1 year ago Encounter to establish care   Primary Care at [facility], Bayard Beaver, MD               pravastatin (PRAVACHOL) 20 MG tablet      Sig: Take 1 tablet (20 mg total) by mouth daily.     Cardiovascular:  Antilipid - Statins Failed - 11/26/2022  3:24 PM      Failed - Lipid Panel in normal range within the last 12 months    Cholesterol, Total  Date Value Ref Range Status  01/29/2022 216 (H) 100 - 199 mg/dL Final   LDL Chol Calc (NIH)  Date Value Ref Range Status  01/29/2022 97 0 - 99 mg/dL Final   HDL  Date Value Ref Range Status  01/29/2022 75 >39 mg/dL Final   Triglycerides  Date Value Ref Range Status  01/29/2022 263 (H) 0 - 149 mg/dL Final         Passed - Patient is not pregnant      Passed - Valid encounter within last 12 months    Recent Outpatient Visits           1 month ago Insomnia, unspecified type   Primary Care at [facility], Clyde Canterbury, MD   9 months ago Pap smear for cervical cancer  screening   Primary Care at [facility], MD   10 months ago Annual physical exam   Primary Care at [facility], MD   1 year ago Encounter to establish care   Primary Care at [facility], Bayard Beaver, MD

## 2022-11-26 NOTE — Telephone Encounter (Signed)
Pt will be leaving the country tomorrow morning and is requesting to receive these meds today.   Medication Refill - Medication: pravastatin (PRAVACHOL) 20 MG tablet   atenolol-chlorthalidone (TENORETIC) 50-25 MG per tablet   Has the patient contacted their pharmacy? Yes.   (Agent: If no, request that the patient contact the pharmacy for the refill. If patient does not wish to contact the pharmacy document the reason why and proceed with request.) (Agent: If yes, when and what did the pharmacy advise?)  Preferred Pharmacy (with phone number or street name):   [id number] [place]  [id number] Phone: [phone]: [phone]   Has the patient been seen for an appointment in the last year OR does the patient have an upcoming appointment? Yes.    Agent: Please be advised that RX refills may take up to 3 business days. We ask that you follow-up with your pharmacy.

## 2022-11-27 ENCOUNTER — Ambulatory Visit: Payer: BLUE CROSS/BLUE SHIELD | Admitting: Family Medicine

## 2022-12-02 ENCOUNTER — Encounter: Payer: Self-pay | Admitting: Family Medicine

## 2022-12-02 ENCOUNTER — Ambulatory Visit (INDEPENDENT_AMBULATORY_CARE_PROVIDER_SITE_OTHER): Payer: BLUE CROSS/BLUE SHIELD | Admitting: Family Medicine

## 2022-12-02 VITALS — BP 135/85 | HR 69 | Temp 98.0°F | Resp 16 | Ht 60.0 in | Wt 147.8 lb

## 2022-12-02 DIAGNOSIS — I10 Essential (primary) hypertension: Secondary | ICD-10-CM | POA: Diagnosis not present

## 2022-12-02 DIAGNOSIS — G4700 Insomnia, unspecified: Secondary | ICD-10-CM | POA: Diagnosis not present

## 2022-12-02 DIAGNOSIS — E7800 Pure hypercholesterolemia, unspecified: Secondary | ICD-10-CM

## 2022-12-02 DIAGNOSIS — F17200 Nicotine dependence, unspecified, uncomplicated: Secondary | ICD-10-CM

## 2022-12-02 DIAGNOSIS — F17210 Nicotine dependence, cigarettes, uncomplicated: Secondary | ICD-10-CM

## 2022-12-02 MED ORDER — ATENOLOL-CHLORTHALIDONE 50-25 MG PO TABS: 1.0000 | ORAL_TABLET | Freq: Every day | ORAL | 1 refills | Status: DC

## 2022-12-02 MED ORDER — KLOR-CON M20 20 MEQ PO TBCR: 20.0000 meq | EXTENDED_RELEASE_TABLET | Freq: Two times a day (BID) | ORAL | 1 refills | Status: DC

## 2022-12-02 MED ORDER — PRAVASTATIN SODIUM 20 MG PO TABS: 20.0000 mg | ORAL_TABLET | Freq: Every day | ORAL | 0 refills | Status: DC

## 2022-12-02 NOTE — Progress Notes (Unsigned)
Established Patient Office Visit  Subjective    Patient ID: Teresa Cabrera, female    DOB: 1961/02/27  Age: 61 y.o. MRN: [id number]  CC:  Chief Complaint  Patient presents with   Medication Refill    HPI Teresa Cabrera presents for follow up of chronic med issues. Patient denies acute complaints.    Outpatient Encounter Medications as of 12/02/2022  Medication Sig   aspirin EC 81 MG tablet Take 81 mg by mouth daily. Swallow whole.   Biotin 1000 MCG CHEW Chew by mouth.   Multiple Vitamin (MULTIVITAMIN PO) Take by mouth daily. Hair,nail,collagen take 4 pills   omeprazole (PRILOSEC) 40 MG capsule TAKE 1 CAPSULE (40 MG TOTAL) BY MOUTH DAILY.   traZODone (DESYREL) 150 MG tablet Take 1 tablet (150 mg total) by mouth at bedtime.   [DISCONTINUED] atenolol-chlorthalidone (TENORETIC) 50-25 MG per tablet Take 1 tablet by mouth at bedtime.   [DISCONTINUED] KLOR-CON M20 20 MEQ tablet Take 20 mEq by mouth 2 (two) times daily.   [DISCONTINUED] pravastatin (PRAVACHOL) 20 MG tablet Take 20 mg by mouth daily.   atenolol-chlorthalidone (TENORETIC) 50-25 MG tablet Take 1 tablet by mouth at bedtime.   KLOR-CON M20 20 MEQ tablet Take 1 tablet (20 mEq total) by mouth 2 (two) times daily.   naproxen (NAPROSYN) 500 MG tablet Take 1 tablet (500 mg total) by mouth 2 (two) times daily. (Patient not taking: Reported on 12/02/2022)   pravastatin (PRAVACHOL) 20 MG tablet Take 1 tablet (20 mg total) by mouth daily.   [DISCONTINUED] fluconazole (DIFLUCAN) 150 MG tablet Take 150 mg by mouth once. (Patient not taking: Reported on 07/01/2022)   No facility-administered encounter medications on file as of 12/02/2022.    Past Medical History:  Diagnosis Date   Anemia    Anxiety    Depression    GERD (gastroesophageal reflux disease)    Hyperlipidemia    Hypertension    Insomnia    Substance abuse ([place])     Past Surgical History:  Procedure Laterality Date   COLONOSCOPY  2012   TUBAL LIGATION       Family History  Problem Relation Age of Onset   Other Mother        lou gehrigs disease   Breast cancer Sister    Aneurysm Sister    Cancer Paternal Grandmother    Colon cancer Neg Hx    Colon polyps Neg Hx    Crohn's disease Neg Hx    Esophageal cancer Neg Hx    Rectal cancer Neg Hx    Stomach cancer Neg Hx     Social History   Socioeconomic History   Marital status: Married    Spouse name: Not on file   Number of children: Not on file   Years of education: Not on file   Highest education level: Not on file  Occupational History   Not on file  Tobacco Use   Smoking status: Some Days    Packs/day: 0.25    Types: Cigarettes    Passive exposure: Current   Smokeless tobacco: Never   Tobacco comments:    No smoking today 07/01/22  Vaping Use   Vaping Use: Never used  Substance and Sexual Activity   Alcohol use: Yes    Alcohol/week: 16.0 standard drinks of alcohol    Types: 16 Standard drinks or equivalent per week    Comment: 1/2 pt. daily   Drug use: Not Currently    Types: Marijuana   Sexual activity:  Not on file  Other Topics Concern   Not on file  Social History Narrative   Not on file   Social Determinants of Health   Financial Resource Strain: Not on file  Food Insecurity: Not on file  Transportation Needs: Not on file  Physical Activity: Not on file  Stress: Not on file  Social Connections: Not on file  Intimate Partner Violence: Not on file    Review of Systems  All other systems reviewed and are negative.       Objective    BP 135/85   Pulse 69   Temp 98 F (36.7 C) (Oral)   Resp 16   Ht 5' (1.524 m)   Wt 147 lb 12.8 oz (67 kg)   HC 5" (12.7 cm)   SpO2 93%   BMI 28.87 kg/m   Physical Exam Vitals and nursing note reviewed.  Constitutional:      General: She is not in acute distress. Cardiovascular:     Rate and Rhythm: Normal rate and regular rhythm.  Pulmonary:     Effort: Pulmonary effort is normal.     Breath sounds:  Normal breath sounds.  Neurological:     General: No focal deficit present.     Mental Status: She is alert and oriented to person, place, and time.         Assessment & Plan:   1. Essential hypertension Appears stable. Continue. Meds refilled  2. Insomnia, unspecified type Improved with present management. continue  3. Hypercholesterolemia Continue. Meds refilled.   4. Smoker Discussed reduction/cessation    Return if symptoms worsen or fail to improve, for physical.   Becky Sax, MD

## 2022-12-03 ENCOUNTER — Encounter: Payer: BLUE CROSS/BLUE SHIELD | Admitting: Surgical

## 2022-12-03 ENCOUNTER — Encounter: Payer: Self-pay | Admitting: Family Medicine

## 2023-01-30 ENCOUNTER — Ambulatory Visit: Payer: Self-pay | Admitting: *Deleted

## 2023-01-30 NOTE — Telephone Encounter (Signed)
Summary: urine has a terrible odor and a smoke color.   Pt stated her urine has a terrible odor and a smoke color. Pt stated her mouth is very dry and she is concerned about diabetes. Pt denied abdominal pain and stated no other symptoms.  Pt scheduled for the first available 02/03/2023.  Pt seeking clinical advice,      Called pt. Call went straight to voice mail. Unable to leave message - voice mail not set up.

## 2023-01-30 NOTE — Telephone Encounter (Signed)
Summary: urine has a terrible odor and a smoke color.   Pt stated her urine has a terrible odor and a smoke color. Pt stated her mouth is very dry and she is concerned about diabetes. Pt denied abdominal pain and stated no other symptoms.  Pt scheduled for the first available 02/03/2023.  Pt seeking clinical advice,          Called patient on [phone] to review urinary sx. No answer, no VM box set up unable to leave message.

## 2023-01-30 NOTE — Telephone Encounter (Signed)
2nd call to [phone] to review urinary sx. No answer, no VM set up. Unable to leave message .

## 2023-02-03 ENCOUNTER — Ambulatory Visit: Payer: BLUE CROSS/BLUE SHIELD | Admitting: Family Medicine

## 2023-02-03 ENCOUNTER — Encounter: Payer: Self-pay | Admitting: Family Medicine

## 2023-02-03 VITALS — BP 120/84 | HR 82 | Temp 98.0°F | Resp 16 | Wt 150.0 lb

## 2023-02-03 DIAGNOSIS — N3001 Acute cystitis with hematuria: Secondary | ICD-10-CM

## 2023-02-03 DIAGNOSIS — R8290 Unspecified abnormal findings in urine: Secondary | ICD-10-CM | POA: Insufficient documentation

## 2023-02-03 LAB — POCT URINALYSIS DIP (CLINITEK)
Bilirubin, UA: NEGATIVE
Glucose, UA: NEGATIVE mg/dL
Nitrite, UA: POSITIVE — AB
POC PROTEIN,UA: 30 — AB
Spec Grav, UA: 1.025 (ref 1.010–1.025)
Urobilinogen, UA: 1 E.U./dL
pH, UA: 7 (ref 5.0–8.0)

## 2023-02-03 MED ORDER — NITROFURANTOIN MONOHYD MACRO 100 MG PO CAPS: 100.0000 mg | ORAL_CAPSULE | Freq: Two times a day (BID) | ORAL | 0 refills | Status: DC

## 2023-02-03 NOTE — Progress Notes (Unsigned)
Patient is here with c/o urinary odor. This has been present for several days . No other concerns

## 2023-02-04 ENCOUNTER — Encounter: Payer: Self-pay | Admitting: Family Medicine

## 2023-02-04 NOTE — Progress Notes (Signed)
Established Patient Office Visit  Subjective    Patient ID: Teresa Cabrera, female    DOB: 07/12/1961  Age: 62 y.o. MRN: [id number]  CC:  Chief Complaint  Patient presents with   urine odor    HPI Teresa Cabrera presents with complaint of odor of urine. Patient denies dysuria or fever/chills.    Outpatient Encounter Medications as of 02/03/2023  Medication Sig   aspirin EC 81 MG tablet Take 81 mg by mouth daily. Swallow whole.   atenolol-chlorthalidone (TENORETIC) 50-25 MG tablet Take 1 tablet by mouth at bedtime.   Biotin 1000 MCG CHEW Chew by mouth.   KLOR-CON M20 20 MEQ tablet Take 1 tablet (20 mEq total) by mouth 2 (two) times daily.   Multiple Vitamin (MULTIVITAMIN PO) Take by mouth daily. Hair,nail,collagen take 4 pills   nitrofurantoin, macrocrystal-monohydrate, (MACROBID) 100 MG capsule Take 1 capsule (100 mg total) by mouth 2 (two) times daily.   omeprazole (PRILOSEC) 40 MG capsule TAKE 1 CAPSULE (40 MG TOTAL) BY MOUTH DAILY.   pravastatin (PRAVACHOL) 20 MG tablet Take 1 tablet (20 mg total) by mouth daily.   traZODone (DESYREL) 150 MG tablet Take 1 tablet (150 mg total) by mouth at bedtime.   No facility-administered encounter medications on file as of 02/03/2023.    Past Medical History:  Diagnosis Date   Anemia    Anxiety    Depression    GERD (gastroesophageal reflux disease)    Hyperlipidemia    Hypertension    Insomnia    Substance abuse ([place])     Past Surgical History:  Procedure Laterality Date   COLONOSCOPY  2012   TUBAL LIGATION      Family History  Problem Relation Age of Onset   Other Mother        lou gehrigs disease   Breast cancer Sister    Aneurysm Sister    Cancer Paternal Grandmother    Colon cancer Neg Hx    Colon polyps Neg Hx    Crohn's disease Neg Hx    Esophageal cancer Neg Hx    Rectal cancer Neg Hx    Stomach cancer Neg Hx     Social History   Socioeconomic History   Marital status: Married    Spouse name: Not on  file   Number of children: Not on file   Years of education: Not on file   Highest education level: Not on file  Occupational History   Not on file  Tobacco Use   Smoking status: Some Days    Packs/day: 0.25    Types: Cigarettes    Passive exposure: Current   Smokeless tobacco: Never   Tobacco comments:    No smoking today 07/01/22  Vaping Use   Vaping Use: Never used  Substance and Sexual Activity   Alcohol use: Yes    Alcohol/week: 16.0 standard drinks of alcohol    Types: 16 Standard drinks or equivalent per week    Comment: 1/2 pt. daily   Drug use: Not Currently    Types: Marijuana   Sexual activity: Not on file  Other Topics Concern   Not on file  Social History Narrative   Not on file   Social Determinants of Health   Financial Resource Strain: Not on file  Food Insecurity: Not on file  Transportation Needs: Not on file  Physical Activity: Not on file  Stress: Not on file  Social Connections: Not on file  Intimate Partner Violence: Not on file  Review of Systems  Constitutional:  Negative for chills and fever.  Genitourinary:  Negative for dysuria and frequency.  All other systems reviewed and are negative.       Objective    BP 120/84   Pulse 82   Temp 98 F (36.7 C) (Oral)   Resp 16   Wt 150 lb (68 kg)   SpO2 93%   BMI 29.29 kg/m   Physical Exam Vitals and nursing note reviewed.  Constitutional:      General: She is not in acute distress. Cardiovascular:     Rate and Rhythm: Normal rate and regular rhythm.  Pulmonary:     Effort: Pulmonary effort is normal.     Breath sounds: Normal breath sounds.  Abdominal:     Palpations: Abdomen is soft.     Tenderness: There is no abdominal tenderness.  Neurological:     General: No focal deficit present.     Mental Status: She is alert and oriented to person, place, and time.         Assessment & Plan:   1. Acute cystitis with hematuria Urine for culture. Macrobid prescribed.  Encouraged adequate and appropriate fluids.  - POCT URINALYSIS DIP (CLINITEK) - Urine Culture    No follow-ups on file.   Becky Sax, MD

## 2023-02-06 LAB — URINE CULTURE

## 2023-02-18 ENCOUNTER — Institutional Professional Consult (permissible substitution): Payer: BLUE CROSS/BLUE SHIELD | Admitting: Plastic Surgery

## 2023-02-26 ENCOUNTER — Other Ambulatory Visit: Payer: Self-pay | Admitting: Family Medicine

## 2023-02-26 NOTE — Telephone Encounter (Signed)
Requested Prescriptions  Pending Prescriptions Disp Refills   pravastatin (PRAVACHOL) 20 MG tablet [Pharmacy Med Name: PRAVASTATIN SODIUM 20 MG TAB] 90 tablet 0    Sig: TAKE 1 TABLET BY MOUTH EVERY DAY     Cardiovascular:  Antilipid - Statins Failed - 02/26/2023  1:53 AM      Failed - Lipid Panel in normal range within the last 12 months    Cholesterol, Total  Date Value Ref Range Status  01/29/2022 216 (H) 100 - 199 mg/dL Final   LDL Chol Calc (NIH)  Date Value Ref Range Status  01/29/2022 97 0 - 99 mg/dL Final   HDL  Date Value Ref Range Status  01/29/2022 75 >39 mg/dL Final   Triglycerides  Date Value Ref Range Status  01/29/2022 263 (H) 0 - 149 mg/dL Final         Passed - Patient is not pregnant      Passed - Valid encounter within last 12 months    Recent Outpatient Visits           3 weeks ago Acute cystitis with hematuria   [place] [facility] at [facility], MD   2 months ago Essential hypertension   [place] [facility] at [facility], MD   4 months ago Insomnia, unspecified type   [place] [facility] at [facility], MD   1 year ago Pap smear for cervical cancer screening   [place] [facility] at [facility], MD   1 year ago Annual physical exam   [place] [facility] at [facility], Clyde Can[facility], [place]               KLOR-CON M20 20 [place] tablet [Pharmacy Med Name: KLOR-CON M20 TABLET] 180 tablet 0    Sig: TAKE 1 [place]     Endocrinology:  Minerals - Potassium Supplementation Passed - 02/26/2023  1:53 AM      Passed - K in normal range and within 360 days    Potassium  Date Value Ref Range Status  06/10/2022 3.5 3.5 - 5.1 mmol/L Final         Passed - Cr in normal range and within 360 days    Creatinine, Ser  Date Value Ref Range Status  06/10/2022 1.00 0.44 - 1.00 mg/dL Final         Passed - Valid encounter within last 12 months     Recent Outpatient Visits           3 weeks ago Acute cystitis with hematuria   [place] [facility] at [facility], MD   2 months ago Essential hypertension   [place] [facility] at [facility], MD   4 months ago Insomnia, unspecified type   [place] [facility] at [facility], MD   1 year ago Pap smear for cervical cancer screening   [place] [facility] at [facility], MD   1 year ago Annual physical exam   [place] [facility] at [facility], MD

## 2023-04-29 ENCOUNTER — Other Ambulatory Visit: Payer: Self-pay | Admitting: Family Medicine

## 2023-05-02 ENCOUNTER — Ambulatory Visit (INDEPENDENT_AMBULATORY_CARE_PROVIDER_SITE_OTHER): Payer: BLUE CROSS/BLUE SHIELD | Admitting: Family Medicine

## 2023-05-02 ENCOUNTER — Encounter: Payer: Self-pay | Admitting: Family Medicine

## 2023-05-02 VITALS — BP 120/80 | HR 74 | Temp 98.1°F | Resp 16 | Wt 146.0 lb

## 2023-05-02 DIAGNOSIS — G4700 Insomnia, unspecified: Secondary | ICD-10-CM

## 2023-05-02 DIAGNOSIS — N3001 Acute cystitis with hematuria: Secondary | ICD-10-CM | POA: Diagnosis not present

## 2023-05-02 DIAGNOSIS — K219 Gastro-esophageal reflux disease without esophagitis: Secondary | ICD-10-CM

## 2023-05-02 DIAGNOSIS — I10 Essential (primary) hypertension: Secondary | ICD-10-CM | POA: Diagnosis not present

## 2023-05-02 LAB — POCT URINALYSIS DIP (CLINITEK)
Bilirubin, UA: NEGATIVE
Blood, UA: NEGATIVE
Glucose, UA: NEGATIVE mg/dL
Nitrite, UA: NEGATIVE
POC PROTEIN,UA: NEGATIVE
Spec Grav, UA: 1.025 (ref 1.010–1.025)
Urobilinogen, UA: 0.2 E.U./dL
pH, UA: 5.5 (ref 5.0–8.0)

## 2023-05-02 MED ORDER — NITROFURANTOIN MONOHYD MACRO 100 MG PO CAPS: 100.0000 mg | ORAL_CAPSULE | Freq: Two times a day (BID) | ORAL | 0 refills | Status: DC

## 2023-05-02 MED ORDER — TRAZODONE HCL 150 MG PO TABS: 150.0000 mg | ORAL_TABLET | Freq: Every day | ORAL | 0 refills | Status: DC

## 2023-05-02 MED ORDER — OMEPRAZOLE 40 MG PO CPDR: 40.0000 mg | DELAYED_RELEASE_CAPSULE | Freq: Every day | ORAL | 0 refills | Status: DC

## 2023-05-08 ENCOUNTER — Encounter: Payer: Self-pay | Admitting: Family Medicine

## 2023-05-08 NOTE — Progress Notes (Signed)
Established Patient Office Visit  Subjective    Patient ID: Teresa Cabrera, female    DOB: [birth year]  Age: 62 y.o. MRN: [id number]  CC:  Chief Complaint  Patient presents with   Urinary Tract Infection    HPI Teresa Cabrera presents to follow up on chronic med issues. She also reports burning with urination that is similar to previous UTI. Denies fever/chills   Outpatient Encounter Medications as of 05/02/2023  Medication Sig   nitrofurantoin, macrocrystal-monohydrate, (MACROBID) 100 MG capsule Take 1 capsule (100 mg total) by mouth 2 (two) times daily.   aspirin EC 81 MG tablet Take 81 mg by mouth daily. Swallow whole.   atenolol-chlorthalidone (TENORETIC) 50-25 MG tablet Take 1 tablet by mouth at bedtime.   KLOR-CON M20 20 MEQ tablet TAKE 1 TABLET BY MOUTH TWICE A DAY   omeprazole (PRILOSEC) 40 MG capsule Take 1 capsule (40 mg total) by mouth daily.   pravastatin (PRAVACHOL) 20 MG tablet TAKE 1 TABLET BY MOUTH EVERY DAY   traZODone (DESYREL) 150 MG tablet Take 1 tablet (150 mg total) by mouth at bedtime.   [DISCONTINUED] Biotin 1000 MCG CHEW Chew by mouth.   [DISCONTINUED] Multiple Vitamin (MULTIVITAMIN PO) Take by mouth daily. Hair,nail,collagen take 4 pills   [DISCONTINUED] nitrofurantoin, macrocrystal-monohydrate, (MACROBID) 100 MG capsule Take 1 capsule (100 mg total) by mouth 2 (two) times daily.   [DISCONTINUED] omeprazole (PRILOSEC) 40 MG capsule TAKE 1 CAPSULE (40 MG TOTAL) BY MOUTH DAILY.   [DISCONTINUED] traZODone (DESYREL) 150 MG tablet Take 1 tablet (150 mg total) by mouth at bedtime.   No facility-administered encounter medications on file as of 05/02/2023.    Past Medical History:  Diagnosis Date   Anemia    Anxiety    Depression    GERD (gastroesophageal reflux disease)    Hyperlipidemia    Hypertension    Insomnia    Substance abuse ([facility])     Past Surgical History:  Procedure Laterality Date   COLONOSCOPY  2012   TUBAL LIGATION      Family History   Problem Relation Age of Onset   Other Mother        lou gehrigs disease   Breast cancer Sister    Aneurysm Sister    Cancer Paternal Grandmother    Colon cancer Neg Hx    Colon polyps Neg Hx    Crohn's disease Neg Hx    Esophageal cancer Neg Hx    Rectal cancer Neg Hx    Stomach cancer Neg Hx     Social History   Socioeconomic History   Marital status: Married    Spouse name: Not on file   Number of children: Not on file   Years of education: Not on file   Highest education level: Not on file  Occupational History   Not on file  Tobacco Use   Smoking status: Some Days    Packs/day: .25    Types: Cigarettes    Passive exposure: Current   Smokeless tobacco: Never   Tobacco comments:    No smoking today 07/01/22  Vaping Use   Vaping Use: Never used  Substance and Sexual Activity   Alcohol use: Yes    Alcohol/week: 16.0 standard drinks of alcohol    Types: 16 Standard drinks or equivalent per week    Comment: 1/2 pt. daily   Drug use: Not Currently    Types: Marijuana   Sexual activity: Not on file  Other Topics Concern   Not  on file  Social History Narrative   Not on file   Social Determinants of Health   Financial Resource Strain: Not on file  Food Insecurity: Not on file  Transportation Needs: Not on file  Physical Activity: Not on file  Stress: Not on file  Social Connections: Not on file  Intimate Partner Violence: Not on file    Review of Systems  All other systems reviewed and are negative.       Objective    BP 120/80   Pulse 74   Temp 98.1 F (36.7 C) (Oral)   Resp 16   Wt 146 lb (66.2 kg)   SpO2 94%   BMI 28.51 kg/m   Physical Exam Vitals and nursing note reviewed.  Constitutional:      General: She is not in acute distress. Cardiovascular:     Rate and Rhythm: Normal rate and regular rhythm.  Pulmonary:     Effort: Pulmonary effort is normal.     Breath sounds: Normal breath sounds.  Abdominal:     Palpations: Abdomen is  soft.     Tenderness: There is no abdominal tenderness.  Neurological:     General: No focal deficit present.     Mental Status: She is alert and oriented to person, place, and time.         Assessment & Plan:   1. Acute cystitis with hematuria Macrobid prescribed. Adequate hydration recommended.  - POCT URINALYSIS DIP (CLINITEK)  2. Essential hypertension Appears stable. Continue   3. Gastroesophageal reflux disease without esophagitis Meds refilled. Continue   4. Insomnia, unspecified type Meds refilled. Continue     No follow-ups on file.   Tommie Raymond, MD

## 2023-06-03 ENCOUNTER — Other Ambulatory Visit: Payer: Self-pay | Admitting: Family Medicine

## 2023-07-23 ENCOUNTER — Other Ambulatory Visit: Payer: Self-pay | Admitting: Family Medicine

## 2023-07-31 ENCOUNTER — Other Ambulatory Visit: Payer: Self-pay

## 2023-07-31 ENCOUNTER — Emergency Department (HOSPITAL_COMMUNITY): Payer: BLUE CROSS/BLUE SHIELD

## 2023-07-31 ENCOUNTER — Emergency Department (HOSPITAL_COMMUNITY)
Admission: EM | Admit: 2023-07-31 | Discharge: 2023-07-31 | Disposition: A | Discharge status: Home / Self Care | Payer: BLUE CROSS/BLUE SHIELD | Attending: Emergency Medicine | Admitting: Emergency Medicine

## 2023-07-31 ENCOUNTER — Encounter (HOSPITAL_COMMUNITY): Payer: Self-pay

## 2023-07-31 ENCOUNTER — Other Ambulatory Visit: Payer: Self-pay | Admitting: Family Medicine

## 2023-07-31 DIAGNOSIS — Z79899 Other long term (current) drug therapy: Secondary | ICD-10-CM | POA: Diagnosis not present

## 2023-07-31 DIAGNOSIS — I10 Essential (primary) hypertension: Secondary | ICD-10-CM | POA: Insufficient documentation

## 2023-07-31 DIAGNOSIS — R202 Paresthesia of skin: Secondary | ICD-10-CM | POA: Diagnosis present

## 2023-07-31 DIAGNOSIS — E876 Hypokalemia: Secondary | ICD-10-CM | POA: Insufficient documentation

## 2023-07-31 DIAGNOSIS — Z7982 Long term (current) use of aspirin: Secondary | ICD-10-CM | POA: Diagnosis not present

## 2023-07-31 LAB — CBC
HCT: 40.1 % (ref 36.0–46.0)
Hemoglobin: 14 g/dL (ref 12.0–15.0)
MCH: 32.5 pg (ref 26.0–34.0)
MCHC: 34.9 g/dL (ref 30.0–36.0)
MCV: 93 fL (ref 80.0–100.0)
Platelets: 372 10*3/uL (ref 150–400)
RBC: 4.31 MIL/uL (ref 3.87–5.11)
RDW: 13.6 % (ref 11.5–15.5)
WBC: 6.1 10*3/uL (ref 4.0–10.5)
nRBC: 0 % (ref 0.0–0.2)

## 2023-07-31 LAB — COMPREHENSIVE METABOLIC PANEL
ALT: 26 U/L (ref 0–44)
AST: 32 U/L (ref 15–41)
Albumin: 4 g/dL (ref 3.5–5.0)
Alkaline Phosphatase: 43 U/L (ref 38–126)
Anion gap: 9 (ref 5–15)
BUN: 15 mg/dL (ref 8–23)
CO2: 25 mmol/L (ref 22–32)
Calcium: 9.3 mg/dL (ref 8.9–10.3)
Chloride: 104 mmol/L (ref 98–111)
Creatinine, Ser: 1.05 mg/dL — ABNORMAL HIGH (ref 0.44–1.00)
GFR, Estimated: >60 mL/min (ref >60)
Glucose, Bld: 110 mg/dL — ABNORMAL HIGH (ref 70–99)
Potassium: 2.8 mmol/L — ABNORMAL LOW (ref 3.5–5.1)
Sodium: 138 mmol/L (ref 135–145)
Total Bilirubin: 0.4 mg/dL (ref 0.3–1.2)
Total Protein: 7.6 g/dL (ref 6.5–8.1)

## 2023-07-31 LAB — URINALYSIS, ROUTINE W REFLEX MICROSCOPIC
Bilirubin Urine: NEGATIVE
Glucose, UA: NEGATIVE mg/dL
Hgb urine dipstick: NEGATIVE
Ketones, ur: NEGATIVE mg/dL
Nitrite: NEGATIVE
Protein, ur: NEGATIVE mg/dL
Specific Gravity, Urine: 1.017 (ref 1.005–1.030)
pH: 5 (ref 5.0–8.0)

## 2023-07-31 LAB — ETHANOL: Alcohol, Ethyl (B): <10 mg/dL (ref <10)

## 2023-07-31 LAB — DIFFERENTIAL
Abs Immature Granulocytes: 0.01 10*3/uL (ref 0.00–0.07)
Basophils Absolute: 0 10*3/uL (ref 0.0–0.1)
Basophils Relative: 1 %
Eosinophils Absolute: 0.2 10*3/uL (ref 0.0–0.5)
Eosinophils Relative: 3 %
Immature Granulocytes: 0 %
Lymphocytes Relative: 38 %
Lymphs Abs: 2.3 10*3/uL (ref 0.7–4.0)
Monocytes Absolute: 0.4 10*3/uL (ref 0.1–1.0)
Monocytes Relative: 6 %
Neutro Abs: 3.2 10*3/uL (ref 1.7–7.7)
Neutrophils Relative %: 52 %

## 2023-07-31 LAB — RAPID URINE DRUG SCREEN, HOSP PERFORMED
Amphetamines: NOT DETECTED
Barbiturates: NOT DETECTED
Benzodiazepines: NOT DETECTED
Cocaine: NOT DETECTED
Opiates: NOT DETECTED
Tetrahydrocannabinol: NOT DETECTED

## 2023-07-31 LAB — PROTIME-INR
INR: 1 (ref 0.8–1.2)
Prothrombin Time: 13.1 seconds (ref 11.4–15.2)

## 2023-07-31 LAB — APTT: aPTT: 28 seconds (ref 24–36)

## 2023-07-31 MED ORDER — POTASSIUM CHLORIDE 20 MEQ PO PACK
80.0000 meq | PACK | Freq: Once | ORAL | Status: AC
  Administered 2023-07-31: 80 meq via ORAL
  Filled 2023-07-31: qty 4

## 2023-07-31 MED ORDER — GADOBUTROL 1 MMOL/ML IV SOLN
7.0000 mL | Freq: Once | INTRAVENOUS | Status: AC | PRN
  Administered 2023-07-31: 7 mL via INTRAVENOUS

## 2023-07-31 MED ORDER — MAGNESIUM OXIDE -MG SUPPLEMENT 400 (240 MG) MG PO TABS
400.0000 mg | ORAL_TABLET | Freq: Once | ORAL | Status: AC
  Administered 2023-07-31: 400 mg via ORAL
  Filled 2023-07-31: qty 1

## 2023-07-31 NOTE — Discharge Instructions (Signed)
Thank you for letting us take care of you today.  Your potassium was 2.8. The remainder of your blood work and CT scan and MRI of your brain was normal. We gave you potassium replacement in the ED. Please resume taking your potassium at home as prescribed by your doctor. Follow up with your doctor in 1 week to discuss your ED visit today and any continued symptoms.  For new or worsening symptoms including chest pain, shortness of breath, weakness on one side of your body, or other new, concerning symptoms, return to the nearest ED for reevaluation.

## 2023-07-31 NOTE — ED Provider Notes (Signed)
[place] EMERGENCY DEPARTMENT AT [facility] Provider Note   CSN: [id number] Arrival date & time: 07/31/23  1352     History  Chief Complaint  Patient presents with   Facial numbness    Teresa Cabrera is a 62 y.o. female with past medical history anemia, anxiety, depression, GERD, hypertension, hyperlipidemia, alcohol use disorder who presents to the ED complaining of left-sided paresthesias that started 3 days ago.  States that this has happened intermittently in the past that she was seen for the same last year with a normal CT scan.  No headache, chest pain, shortness of breath, neck or back pain, visual changes, fever, or other acute symptoms.  No fall or injury.  States that she does have a history of alcohol use disorder and stopped drinking last week.    Home Medications Prior to Admission medications   Medication Sig Start Date End Date Taking? Authorizing Provider  aspirin EC 81 MG tablet Take 81 mg by mouth daily. Swallow whole.    [provider]  atenolol-chlorthalidone (TENORETIC) 50-25 MG tablet TAKE 1 TABLET BY MOUTH EVERYDAY AT BEDTIME 06/03/23   Georganna Skeans, MD  KLOR-CON M20 20 MEQ tablet TAKE 1 TABLET BY MOUTH TWICE A DAY 06/03/23   Georganna Skeans, MD  nitrofurantoin, macrocrystal-monohydrate, (MACROBID) 100 MG capsule Take 1 capsule (100 mg total) by mouth 2 (two) times daily. 05/02/23   Georganna Skeans, MD  omeprazole (PRILOSEC) 40 MG capsule TAKE 1 CAPSULE (40 MG TOTAL) BY MOUTH DAILY. 07/24/23   Georganna Skeans, MD  pravastatin (PRAVACHOL) 20 MG tablet TAKE 1 TABLET BY MOUTH EVERY DAY 05/08/23   Georganna Skeans, MD  traZODone (DESYREL) 150 MG tablet Take 1 tablet (150 mg total) by mouth at bedtime. 05/02/23   Georganna Skeans, MD      Allergies    Alprazolam, Alprazolam, and Crestor [rosuvastatin calcium]    Review of Systems   Review of Systems  All other systems reviewed and are negative.   Physical Exam Updated Vital Signs BP 114/80    Pulse 61   Temp 98.2 F (36.8 C) (Oral)   Resp 16   SpO2 99%  Physical Exam Vitals and nursing note reviewed.  Constitutional:      General: She is not in acute distress.    Appearance: Normal appearance.  HENT:     Head: Normocephalic and atraumatic.     Mouth/Throat:     Mouth: Mucous membranes are moist.  Eyes:     General: No visual field deficit.    Conjunctiva/sclera: Conjunctivae normal.  Cardiovascular:     Rate and Rhythm: Normal rate and regular rhythm.     Heart sounds: No murmur heard. Pulmonary:     Effort: Pulmonary effort is normal.     Breath sounds: Normal breath sounds.  Abdominal:     General: Abdomen is flat.     Palpations: Abdomen is soft.     Tenderness: There is no abdominal tenderness.  Musculoskeletal:        General: Normal range of motion.     Cervical back: Neck supple.     Right lower leg: No edema.     Left lower leg: No edema.     Comments: No midline CTL spinal tenderness, step-offs, or deformities  Skin:    General: Skin is warm and dry.     Capillary Refill: Capillary refill takes less than 2 seconds.  Neurological:     General: No focal deficit present.  Mental Status: She is alert and oriented to person, place, and time.     GCS: GCS eye subscore is 4. GCS verbal subscore is 5. GCS motor subscore is 6.     Cranial Nerves: Cranial nerves 2-12 are intact. No cranial nerve deficit, dysarthria or facial asymmetry.     Motor: Motor function is intact. No weakness, tremor, atrophy, abnormal muscle tone, seizure activity or pronator drift.     Coordination: Coordination is intact.     Gait: Gait is intact.     Comments: Sensation intact bilaterally and equal to light touch  Psychiatric:        Behavior: Behavior normal.     ED Results / Procedures / Treatments   Labs (all labs ordered are listed, but only abnormal results are displayed) Labs Reviewed  COMPREHENSIVE METABOLIC PANEL - Abnormal; Notable for the following components:       Result Value   Potassium 2.8 (*)    Glucose, Bld 110 (*)    Creatinine, Ser 1.05 (*)    All other components within normal limits  URINALYSIS, ROUTINE W REFLEX MICROSCOPIC - Abnormal; Notable for the following components:   APPearance CLOUDY (*)    Leukocytes,Ua LARGE (*)    Bacteria, UA MANY (*)    All other components within normal limits  ETHANOL  PROTIME-INR  APTT  CBC  DIFFERENTIAL  RAPID URINE DRUG SCREEN, HOSP PERFORMED  I-STAT CHEM 8, ED    EKG EKG Interpretation Date/Time:  Thursday July 31 2023 14:54:19 EDT Ventricular Rate:  58 PR Interval:  151 QRS Duration:  97 QT Interval:  497 QTC Calculation: 489 R Axis:   37  Text Interpretation: Sinus rhythm RSR' in V1 or V2, right VCD or RVH Borderline prolonged QT interval Confirmed by Lorre Nick ([id number]) on 07/31/2023 7:11:06 PM  Radiology MR Brain W and Wo Contrast  Result Date: 07/31/2023 CLINICAL DATA:  Acute neurologic deficit. Left-sided facial numbness and body numbness EXAM: MRI HEAD WITHOUT AND WITH CONTRAST TECHNIQUE: Multiplanar, multiecho pulse sequences of the brain and surrounding structures were obtained without and with intravenous contrast. CONTRAST:  7mL GADAVIST GADOBUTROL 1 MMOL/ML IV SOLN COMPARISON:  06/10/2022 FINDINGS: Brain: No acute infarct, mass effect or extra-axial collection. No acute or chronic hemorrhage. Normal white matter signal, parenchymal volume and CSF spaces. The midline structures are normal. Vascular: Major flow voids are preserved. Skull and upper cervical spine: Normal calvarium and skull base. Visualized upper cervical spine and soft tissues are normal. Sinuses/Orbits:No paranasal sinus fluid levels or advanced mucosal thickening. No mastoid or middle ear effusion. Normal orbits. IMPRESSION: Normal brain MRI. Electronically Signed   By: Deatra Robinson M.D.   On: 07/31/2023 19:25   CT HEAD WO CONTRAST  Result Date: 07/31/2023 CLINICAL DATA:  Acute stroke suspected, left-sided  numbness EXAM: CT HEAD WITHOUT CONTRAST TECHNIQUE: Contiguous axial images were obtained from the base of the skull through the vertex without intravenous contrast. RADIATION DOSE REDUCTION: This exam was performed according to the departmental dose-optimization program which includes automated exposure control, adjustment of the mA and/or kV according to patient size and/or use of iterative reconstruction technique. COMPARISON:  Head CT 06/10/2022.  MRI brain 06/10/2022. FINDINGS: Brain: No evidence of acute infarction, hemorrhage, hydrocephalus, extra-axial collection or mass lesion/mass effect. Vascular: No hyperdense vessel or unexpected calcification. Skull: Normal. Negative for fracture or focal lesion. Sinuses/Orbits: No acute finding. Other: None. IMPRESSION: No acute intracranial abnormality. Electronically Signed   By: Mcneil Sober.D.  On: 07/31/2023 15:04    Procedures Procedures    Medications Ordered in ED Medications  potassium chloride (KLOR-CON) packet 80 mEq (has no administration in time range)  magnesium oxide (MAG-OX) tablet 400 mg (has no administration in time range)  gadobutrol (GADAVIST) 1 MMOL/ML injection 7 mL (7 mLs Intravenous Contrast Given 07/31/23 1902)    ED Course/ Medical Decision Making/ A&P                                 Medical Decision Making Amount and/or Complexity of Data Reviewed Labs: ordered. Decision-making details documented in ED Course. Radiology: ordered. Decision-making details documented in ED Course. ECG/medicine tests: ordered. Decision-making details documented in ED Course.  Risk OTC drugs. Prescription drug management.   Medical Decision Making:   Madisen Zdrojewski is a 62 y.o. female who presented to the ED today with paresthesias detailed above.    Patient's presentation is complicated by their history of hypertension, hyperlipidemia, substance use disorder.  Complete initial physical exam performed, notably the patient was  neurologically intact.    Reviewed and confirmed nursing documentation for past medical history, family history, social history.    Initial Assessment:   With the patient's presentation, differential diagnosis includes but is not limited to CVA/TIA, ICH, lesion, cervical radiculopathy, anxiety, electrolyte disturbance, anemia, ACS. This is most consistent with an acute complicated illness  Initial Plan:  Screening labs including CBC and Metabolic panel to evaluate for infectious or metabolic etiology of disease.  Coags, ethanol, UDS as part of stroke workup Urinalysis with reflex culture ordered to evaluate for UTI or relevant urologic/nephrologic pathology.  EKG to evaluate for cardiac pathology Objective evaluation as below reviewed   Initial Study Results:   Laboratory  All laboratory results reviewed without evidence of clinically relevant pathology.   Exceptions include: Potassium 2.8, creatinine 1.05 similar to the baseline, UA appears contaminated  EKG EKG was reviewed independently.  Normal sinus rhythm.  No acute ST-T changes.  No STEMI.  Radiology:  All images reviewed independently. Agree with radiology report at this time.   MR Brain W and Wo Contrast  Result Date: 07/31/2023 CLINICAL DATA:  Acute neurologic deficit. Left-sided facial numbness and body numbness EXAM: MRI HEAD WITHOUT AND WITH CONTRAST TECHNIQUE: Multiplanar, multiecho pulse sequences of the brain and surrounding structures were obtained without and with intravenous contrast. CONTRAST:  7mL GADAVIST GADOBUTROL 1 MMOL/ML IV SOLN COMPARISON:  06/10/2022 FINDINGS: Brain: No acute infarct, mass effect or extra-axial collection. No acute or chronic hemorrhage. Normal white matter signal, parenchymal volume and CSF spaces. The midline structures are normal. Vascular: Major flow voids are preserved. Skull and upper cervical spine: Normal calvarium and skull base. Visualized upper cervical spine and soft tissues are  normal. Sinuses/Orbits:No paranasal sinus fluid levels or advanced mucosal thickening. No mastoid or middle ear effusion. Normal orbits. IMPRESSION: Normal brain MRI. Electronically Signed   By: Deatra Robinson M.D.   On: 07/31/2023 19:25   CT HEAD WO CONTRAST  Result Date: 07/31/2023 CLINICAL DATA:  Acute stroke suspected, left-sided numbness EXAM: CT HEAD WITHOUT CONTRAST TECHNIQUE: Contiguous axial images were obtained from the base of the skull through the vertex without intravenous contrast. RADIATION DOSE REDUCTION: This exam was performed according to the departmental dose-optimization program which includes automated exposure control, adjustment of the mA and/or kV according to patient size and/or use of iterative reconstruction technique. COMPARISON:  Head CT 06/10/2022.  MRI brain  06/10/2022. FINDINGS: Brain: No evidence of acute infarction, hemorrhage, hydrocephalus, extra-axial collection or mass lesion/mass effect. Vascular: No hyperdense vessel or unexpected calcification. Skull: Normal. Negative for fracture or focal lesion. Sinuses/Orbits: No acute finding. Other: None. IMPRESSION: No acute intracranial abnormality. Electronically Signed   By: Darliss Cheney M.D.   On: 07/31/2023 15:04      Final Assessment and Plan:   62 year old female who presents to the ED with left-sided paresthesias that have been ongoing for the last few days.  Has a history of the symptoms intermittently with previous negative CT scan 1 year ago.  On exam, neurologically intact.  No recent volume losses.  No infectious symptoms.  No headache, neck pain, chest pain, back pain, or any other acute symptoms.  On initial assessment, CBC has resulted which is normal but most labs are pending.  CT brain has resulted and is also unremarkable.  With patient's symptoms, discussed risk/benefits of obtaining MRI and patient agreeable to proceed.  Of note, she did recently discontinue alcohol use that this could be a contributing  factor in her symptoms as well.  MRI brain also normal.  Remainder of labs reveal a urinalysis which appears contaminated, normal coags.  Labs do reveal a potassium of 2.8.  Patient notes on recheck that she did recently miss several days of potassium last week and has only been taking 1 pill a day when she is supposed to take 2.  Discussed with patient that this could be contributing to her symptoms.  We will replete potassium here as well as magnesium to augment.  Discussed with patient that she should resume potassium regimen as ordered by her outpatient team and follow-up with her PCP next week for reevaluation and discussion of any continued symptoms.  Patient agreeable with plan.  Vital signs reassuring.  Strict ED return precautions given, all questions answered, and stable for discharge.   Clinical Impression:  1. Hypokalemia   2. Paresthesias      Discharge           Final Clinical Impression(s) / ED Diagnoses Final diagnoses:  Hypokalemia  Paresthesias    Rx / DC Orders ED Discharge Orders     None         Richardson Dopp 07/31/23 2020    Lorre Nick, MD 07/31/23 2302

## 2023-07-31 NOTE — ED Provider Triage Note (Signed)
Emergency Medicine Provider Triage Evaluation Note  Teresa Cabrera , a 62 y.o. female  was evaluated in triage.  Pt complains of left-sided numbness.  Patient reports that on Monday she developed left-sided numbness present in her lower extremities, upper extremities and left side of her face.  She reports that this sensation has been waxing and waning, on and off since Monday.  She reports that she was concerned that she had a stroke however she states that she did not want to come in for evaluation.  She states that she had work and this is the reason why she did not come in.  She denies weakness on her left side, denies slurred speech, facial droop.  She reports that she began feeling this way on Monday.  ED stroke order set has been initiated, we will not page out a code stroke as the patient is outside of the window.  Review of Systems  Positive:  Negative:   Physical Exam  BP 113/82   Pulse 64   Temp 98.2 F (36.8 C) (Oral)   Resp 16   SpO2 92%  Gen:   Awake, no distress   Resp:  Normal effort  MSK:   Moves extremities without difficulty  Other:  Reassuring neurological examination.  CN II through intact.  No pronator drift, no slurred speech, no facial droop.  5 out of 5 strength bilateral upper and lower extremities.  Medical Decision Making  Medically screening exam initiated at 2:26 PM.  Appropriate orders placed.  Ayris Delfino was informed that the remainder of the evaluation will be completed by another provider, this initial triage assessment does not replace that evaluation, and the importance of remaining in the ED until their evaluation is complete.     Al Decant, PA-C 07/31/23 1427

## 2023-07-31 NOTE — ED Triage Notes (Signed)
Pt presents with c/o left side facial numbness and numbness down the left side of her body. Pt reports these symptoms have been present since Monday.

## 2023-09-16 ENCOUNTER — Ambulatory Visit: Payer: Self-pay

## 2023-09-16 NOTE — Telephone Encounter (Signed)
Reason for Disposition  [1] MODERATE pain (e.g., interferes with normal activities) AND [2] pain comes and goes (cramps) AND [3] present > 24 hours  (Exception: Pain with Vomiting or Diarrhea - see that Guideline.)  Answer Assessment - Initial Assessment Questions 1. LOCATION: "Where does it hurt?"      [place]  2. RADIATION: "Does the pain shoot anywhere else?" (e.g., chest, back)     no 3. ONSET: "When did the pain begin?" (e.g., minutes, hours or days ago)      On and off for weeks  4. SUDDEN: "Gradual or sudden onset?"     Gradually  5. PATTERN "Does the pain come and go, or is it constant?"    - If it comes and goes: "How long does it last?" "Do you have pain now?"     (Note: Comes and goes means the pain is intermittent. It goes away completely between bouts.)    - If constant: "Is it getting better, staying the same, or getting worse?"      (Note: Constant means the pain never goes away completely; most serious pain is constant and gets worse.)      Comes and goes  6. SEVERITY: "How bad is the pain?"  (e.g., Scale 1-10; mild, moderate, or severe)    - MILD (1-3): Doesn't interfere with normal activities, abdomen soft and not tender to touch.     - MODERATE (4-7): Interferes with normal activities or awakens from sleep, abdomen tender to touch.     - SEVERE (8-10): Excruciating pain, doubled over, unable to do any normal activities.       6 7. RECURRENT SYMPTOM: "Have you ever had this type of stomach pain before?" If Yes, ask: "When was the last time?" and "What happened that time?"      yes 8. CAUSE: "What do you think is causing the stomach pain?"     Ice cream may aggravated, has been drinking ETOH due to stress 10. OTHER SYMPTOMS: "Do you have any other symptoms?" (e.g., back pain, diarrhea, fever, urination pain, vomiting)       Diarrhea  11. PREGNANCY: "Is there any chance you are pregnant?" "When was your last menstrual period?"       N/a  Protocols used: Abdominal Pain -  [facility]

## 2023-09-17 NOTE — Telephone Encounter (Signed)
Pt has been rescheduled for tomorrow 09/26 at 11

## 2023-09-18 ENCOUNTER — Telehealth: Payer: Self-pay

## 2023-09-18 ENCOUNTER — Ambulatory Visit (INDEPENDENT_AMBULATORY_CARE_PROVIDER_SITE_OTHER): Payer: BLUE CROSS/BLUE SHIELD | Admitting: Family

## 2023-09-18 ENCOUNTER — Encounter: Payer: Self-pay | Admitting: Family

## 2023-09-18 ENCOUNTER — Other Ambulatory Visit: Payer: Self-pay | Admitting: Family Medicine

## 2023-09-18 VITALS — BP 139/88 | HR 64 | Temp 97.2°F | Ht 65.0 in | Wt 147.0 lb

## 2023-09-18 DIAGNOSIS — R109 Unspecified abdominal pain: Secondary | ICD-10-CM | POA: Diagnosis not present

## 2023-09-18 DIAGNOSIS — Z23 Encounter for immunization: Secondary | ICD-10-CM | POA: Diagnosis not present

## 2023-09-18 DIAGNOSIS — M79601 Pain in right arm: Secondary | ICD-10-CM | POA: Diagnosis not present

## 2023-09-18 MED ORDER — ATENOLOL-CHLORTHALIDONE 50-25 MG PO TABS: 1.0000 | ORAL_TABLET | Freq: Every day | ORAL | 1 refills | Status: DC

## 2023-09-18 MED ORDER — SUCRALFATE 1 GM/10ML PO SUSP
1.0000 g | Freq: Two times a day (BID) | ORAL | 0 refills | Status: DC
Start: 2023-09-18 — End: 2023-10-06

## 2023-09-18 MED ORDER — MELOXICAM 7.5 MG PO TABS
7.5000 mg | ORAL_TABLET | Freq: Every day | ORAL | 0 refills | Status: DC
Start: 2023-09-18 — End: 2023-10-15

## 2023-09-18 NOTE — Progress Notes (Signed)
Patient ID: Annecia Becton, female    DOB: 05-13-1961  MRN: [id number]  CC: Abdominal Pain  Subjective: Teresa Cabrera is a 62 y.o. female who presents for abdominal pain.   Her concerns today include:  09/16/2023 per triage RN note: Reason for Disposition  [1] MODERATE pain (e.g., interferes with normal activities) AND [2] pain comes and goes (cramps) AND [3] present > 24 hours  (Exception: Pain with Vomiting or Diarrhea - see that Guideline.)  Answer Assessment - Initial Assessment Questions 1. LOCATION: "Where does it hurt?"      [place]  2. RADIATION: "Does the pain shoot anywhere else?" (e.g., chest, back)     no 3. ONSET: "When did the pain begin?" (e.g., minutes, hours or days ago)      On and off for weeks  4. SUDDEN: "Gradual or sudden onset?"     Gradually  5. PATTERN "Does the pain come and go, or is it constant?"    - If it comes and goes: "How long does it last?" "Do you have pain now?"     (Note: Comes and goes means the pain is intermittent. It goes away completely between bouts.)    - If constant: "Is it getting better, staying the same, or getting worse?"      (Note: Constant means the pain never goes away completely; most serious pain is constant and gets worse.)      Comes and goes  6. SEVERITY: "How bad is the pain?"  (e.g., Scale 1-10; mild, moderate, or severe)    - MILD (1-3): Doesn't interfere with normal activities, abdomen soft and not tender to touch.     - MODERATE (4-7): Interferes with normal activities or awakens from sleep, abdomen tender to touch.     - SEVERE (8-10): Excruciating pain, doubled over, unable to do any normal activities.       6 7. RECURRENT SYMPTOM: "Have you ever had this type of stomach pain before?" If Yes, ask: "When was the last time?" and "What happened that time?"      yes 8. CAUSE: "What do you think is causing the stomach pain?"     Ice cream may aggravated, has been drinking ETOH due to stress 10. OTHER SYMPTOMS: "Do you  have any other symptoms?" (e.g., back pain, diarrhea, fever, urination pain, vomiting)       Diarrhea  11. PREGNANCY: "Is there any chance you are pregnant?" "When was your last menstrual period?"       N/a  Protocols used: Abdominal Pain - Female-A-AH   Today's office visit 09/18/2023: - Patient's abdominal pain report consistent with triage RN call note. She denies red flag symptoms associated with abdominal pain. She is taking Omeprazole with minimal relief.  - Right lower extremity discomfort. 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 She denies red flag symptoms.   Patient Active Problem List   Diagnosis Date Noted   Bad odor of urine 02/03/2023   Encounter for IUD removal 12/28/2019   Abnormal mammogram of right breast 06/10/2016   Family history of breast cancer in sister 06/10/2016   Nonspecific immunological findings 02/04/2014   Hypercholesterolemia 01/18/2014   Vitamin D deficiency 01/18/2014   Nondependent alcohol abuse 02/02/2013   Essential hypertension 02/02/2013   DUB (dysfunctional uterine bleeding) 04/09/2012   Alcohol abuse 04/09/2012   Smoker 04/09/2012   HTN (hypertension) 04/09/2012   Active smoker 04/09/2012   Dysfunctional uterine bleeding 04/09/2012  Current smoker 04/09/2012   Insomnia      Current Outpatient Medications on File Prior to Visit  Medication Sig Dispense Refill   aspirin EC 81 MG tablet Take 81 mg by mouth daily. Swallow whole.     atenolol-chlorthalidone (TENORETIC) 50-25 MG tablet TAKE 1 TABLET BY MOUTH EVERYDAY AT BEDTIME 90 tablet 1   KLOR-CON M20 20 MEQ tablet TAKE 1 TABLET BY MOUTH TWICE A DAY 180 tablet 0   omeprazole (PRILOSEC) 40 MG capsule TAKE 1 CAPSULE (40 MG TOTAL) BY MOUTH DAILY. 90 capsule 0   pravastatin (PRAVACHOL) 20 MG tablet TAKE 1 TABLET BY MOUTH EVERY DAY 90 tablet 0   traZODone (DESYREL) 150 MG tablet TAKE 1 TABLET BY MOUTH AT BEDTIME. 90 tablet 0   No current facility-administered  medications on file prior to visit.    Allergies  Allergen Reactions   Alprazolam Hives   Alprazolam Hives   Crestor [Rosuvastatin Calcium] Other (See Comments)    Joint pain    Social History   Socioeconomic History   Marital status: Married    Spouse name: Not on file   Number of children: Not on file   Years of education: Not on file   Highest education level: Not on file  Occupational History   Not on file  Tobacco Use   Smoking status: Some Days    Current packs/day: 0.25    Types: Cigarettes    Passive exposure: Current   Smokeless tobacco: Never   Tobacco comments:    No smoking today 07/01/22  Vaping Use   Vaping status: Never Used  Substance and Sexual Activity   Alcohol use: Yes    Alcohol/week: 16.0 standard drinks of alcohol    Types: 16 Standard drinks or equivalent per week    Comment: 1/2 pt. daily   Drug use: Not Currently    Types: Marijuana   Sexual activity: Not on file  Other Topics Concern   Not on file  Social History Narrative   Not on file   Social Determinants of Health   Financial Resource Strain: Not on file  Food Insecurity: Not on file  Transportation Needs: Not on file  Physical Activity: Not on file  Stress: Not on file  Social Connections: Not on file  Intimate Partner Violence: Not on file    Family History  Problem Relation Age of Onset   Other Mother        lou gehrigs disease   Breast cancer Sister    Aneurysm Sister    Cancer Paternal Grandmother    Colon cancer Neg Hx    Colon polyps Neg Hx    Crohn's disease Neg Hx    Esophageal cancer Neg Hx    Rectal cancer Neg Hx    Stomach cancer Neg Hx     Past Surgical History:  Procedure Laterality Date   COLONOSCOPY  2012   TUBAL LIGATION      ROS: Review of Systems Negative except as stated above  PHYSICAL EXAM: BP 139/88   Pulse 64   Temp (!) 97.2 F (36.2 C) (Oral)   Ht 5\' 5"  (1.651 m)   Wt 147 lb (66.7 kg)   SpO2 96%   BMI 24.46 kg/m   Physical  Exam HENT:     Head: Normocephalic and atraumatic.     Nose: Nose normal.     Mouth/Throat:     Mouth: Mucous membranes are moist.     Pharynx: Oropharynx is clear.  Eyes:     Extraocular Movements: Extraocular movements intact.     Conjunctiva/sclera: Conjunctivae normal.     Pupils: Pupils are equal, round, and reactive to light.  Cardiovascular:     Rate and Rhythm: Normal rate and regular rhythm.     Pulses: Normal pulses.     Heart sounds: Normal heart sounds.  Pulmonary:     Effort: Pulmonary effort is normal.     Breath sounds: Normal breath sounds.  Abdominal:     General: Bowel sounds are normal.     Palpations: Abdomen is soft.  Musculoskeletal:        General: Normal range of motion.     Right shoulder: Normal.     Left shoulder: Normal.     Right upper arm: Normal.     Left upper arm: Normal.     Right elbow: Normal.     Left elbow: Normal.     Right forearm: Normal.     Left forearm: Normal.     Right wrist: Normal.     Left wrist: Normal.     Right hand: Normal.     Left hand: Normal.     Cervical back: Normal, normal range of motion and neck supple.     Thoracic back: Normal.     Lumbar back: Normal.     Right hip: Normal.     Left hip: Normal.     Right upper leg: Normal.     Left upper leg: Normal.     Right knee: Normal.     Left knee: Normal.     Right lower leg: Normal.     Left lower leg: Normal.     Right ankle: Normal.     Left ankle: Normal.     Right foot: Normal.     Left foot: Normal.  Neurological:     General: No focal deficit present.     Mental Status: She is alert and oriented to person, place, and time.  Psychiatric:        Mood and Affect: Mood normal.        Behavior: Behavior normal.     ASSESSMENT AND PLAN: 1. Abdominal pain, unspecified abdominal location - Sucralfate as prescribed. Counseled on medication adherence. - Routine screening.  - Referral to Gastroenterology for evaluation/management. - Follow-up with  primary provider in 2 weeks or sooner if needed.  - CMP14+EGFR - POCT URINALYSIS DIP (CLINITEK); Future - Cervicovaginal ancillary only - Amylase - Lipase - sucralfate (CARAFATE) 1 GM/10ML suspension; Take 10 mLs (1 g total) by mouth 2 (two) times daily.  Dispense: 420 mL; Refill: 0 - Ambulatory referral to Gastroenterology - HIV antibody (with reflex)  2. Pain of right upper extremity - Meloxicam as prescribed. Counseled on medication adherence/adverse effects.  - Follow-up with primary provider in 2 weeks or sooner if needed. - meloxicam (MOBIC) 7.5 MG tablet; Take 1 tablet (7.5 mg total) by mouth daily.  Dispense: 30 tablet; Refill: 0  3. Immunization due - Administered.  - Flu vaccine trivalent PF, 6mos and older(Flulaval,Afluria,Fluarix,Fluzone)    Patient was given the opportunity to ask questions.  Patient verbalized understanding of the plan and was able to repeat key elements of the plan. Patient was given clear instructions to go to Emergency Department or return to medical center if symptoms don't improve, worsen, or new problems develop.The patient verbalized understanding.   Orders Placed This Encounter  Procedures   Flu vaccine trivalent PF, 6mos and older(Flulaval,Afluria,Fluarix,Fluzone)  CMP14+EGFR   Amylase   Lipase   HIV antibody (with reflex)   Ambulatory referral to Gastroenterology   POCT URINALYSIS DIP (CLINITEK)     Requested Prescriptions   Signed Prescriptions Disp Refills   meloxicam (MOBIC) 7.5 MG tablet 30 tablet 0    Sig: Take 1 tablet (7.5 mg total) by mouth daily.   sucralfate (CARAFATE) 1 GM/10ML suspension 420 mL 0    Sig: Take 10 mLs (1 g total) by mouth 2 (two) times daily.    Return in about 2 weeks (around 10/02/2023) for Follow-Up or next available with Georganna Skeans, MD.  Rema Fendt, NP

## 2023-09-18 NOTE — Telephone Encounter (Signed)
This patient was seen today 09/18/2023 and needs her atenolol refilled. Can you send this in for her please.

## 2023-09-22 LAB — CMP14+EGFR
ALT: 20 [IU]/L (ref 0–32)
AST: 24 [IU]/L (ref 0–40)
Albumin: 4.3 g/dL (ref 3.9–4.9)
Alkaline Phosphatase: 52 [IU]/L (ref 44–121)
BUN/Creatinine Ratio: 13 (ref 12–28)
BUN: 16 mg/dL (ref 8–27)
Bilirubin Total: 0.3 mg/dL (ref 0.0–1.2)
CO2: 19 mmol/L — ABNORMAL LOW (ref 20–29)
Calcium: 9.7 mg/dL (ref 8.7–10.3)
Chloride: 105 mmol/L (ref 96–106)
Creatinine, Ser: 1.2 mg/dL — ABNORMAL HIGH (ref 0.57–1.00)
Globulin, Total: 3.1 g/dL (ref 1.5–4.5)
Glucose: 91 mg/dL (ref 70–99)
Potassium: 4.3 mmol/L (ref 3.5–5.2)
Sodium: 143 mmol/L (ref 134–144)
Total Protein: 7.4 g/dL (ref 6.0–8.5)
eGFR: 51 mL/min/{1.73_m2} — ABNORMAL LOW (ref >59)

## 2023-09-22 LAB — LIPASE: Lipase: 26 U/L (ref 14–72)

## 2023-09-22 LAB — SPECIMEN STATUS REPORT

## 2023-09-22 LAB — AMYLASE: Amylase: 31 U/L (ref 31–110)

## 2023-09-22 LAB — HIV ANTIBODY (ROUTINE TESTING W REFLEX): HIV Screen 4th Generation wRfx: NONREACTIVE

## 2023-10-06 ENCOUNTER — Other Ambulatory Visit: Payer: Self-pay

## 2023-10-06 ENCOUNTER — Other Ambulatory Visit: Payer: Self-pay | Admitting: Family

## 2023-10-06 ENCOUNTER — Ambulatory Visit: Payer: BLUE CROSS/BLUE SHIELD | Admitting: Family

## 2023-10-06 DIAGNOSIS — R109 Unspecified abdominal pain: Secondary | ICD-10-CM

## 2023-10-06 MED ORDER — SUCRALFATE 1 GM/10ML PO SUSP: 1.0000 g | Freq: Two times a day (BID) | ORAL | 0 refills | Status: DC

## 2023-10-15 ENCOUNTER — Other Ambulatory Visit: Payer: Self-pay | Admitting: Family

## 2023-10-15 ENCOUNTER — Other Ambulatory Visit: Payer: Self-pay

## 2023-10-15 ENCOUNTER — Other Ambulatory Visit: Payer: BLUE CROSS/BLUE SHIELD

## 2023-10-15 DIAGNOSIS — M79601 Pain in right arm: Secondary | ICD-10-CM

## 2023-10-15 MED ORDER — MELOXICAM 7.5 MG PO TABS: 7.5000 mg | ORAL_TABLET | Freq: Every day | ORAL | 0 refills | Status: DC

## 2023-10-21 ENCOUNTER — Ambulatory Visit (INDEPENDENT_AMBULATORY_CARE_PROVIDER_SITE_OTHER): Payer: BLUE CROSS/BLUE SHIELD | Admitting: Family

## 2023-10-21 ENCOUNTER — Encounter: Payer: Self-pay | Admitting: Family

## 2023-10-21 VITALS — BP 125/83 | HR 72 | Temp 98.3°F | Ht 65.0 in | Wt 148.0 lb

## 2023-10-21 DIAGNOSIS — R109 Unspecified abdominal pain: Secondary | ICD-10-CM | POA: Diagnosis not present

## 2023-10-21 DIAGNOSIS — Z1231 Encounter for screening mammogram for malignant neoplasm of breast: Secondary | ICD-10-CM

## 2023-10-21 NOTE — Progress Notes (Signed)
Patient ID: Teresa Cabrera, female    DOB: 06-May-1961  MRN: [id number]  CC: Abdominal Pain Follow-Up  Subjective: Teresa Cabrera is a 62 y.o. female who presents for abdominal pain follow-up.  Her concerns today include:  States she doesn't notice abdominal pain anymore. She denies red flag symptoms. Reports here to have kidney function rechecked.  Patient Active Problem List   Diagnosis Date Noted   Bad odor of urine 02/03/2023   Encounter for IUD removal 12/28/2019   Abnormal mammogram of right breast 06/10/2016   Family history of breast cancer in sister 06/10/2016   Nonspecific immunological findings 02/04/2014   Hypercholesterolemia 01/18/2014   Vitamin D deficiency 01/18/2014   Nondependent alcohol abuse 02/02/2013   Essential hypertension 02/02/2013   DUB (dysfunctional uterine bleeding) 04/09/2012   Alcohol abuse 04/09/2012   Smoker 04/09/2012   HTN (hypertension) 04/09/2012   Current smoker 04/09/2012   Dysfunctional uterine bleeding 04/09/2012   Current smoker 04/09/2012   Insomnia      Current Outpatient Medications on File Prior to Visit  Medication Sig Dispense Refill   aspirin EC 81 MG tablet Take 81 mg by mouth daily. Swallow whole.     atenolol-chlorthalidone (TENORETIC) 50-25 MG tablet Take 1 tablet by mouth daily. 90 tablet 1   KLOR-CON M20 20 MEQ tablet TAKE 1 TABLET BY MOUTH TWICE A DAY 180 tablet 0   meloxicam (MOBIC) 7.5 MG tablet TAKE 1 TABLET BY MOUTH EVERY DAY 30 tablet 0   meloxicam (MOBIC) 7.5 MG tablet Take 1 tablet (7.5 mg total) by mouth daily. 30 tablet 0   omeprazole (PRILOSEC) 40 MG capsule TAKE 1 CAPSULE (40 MG TOTAL) BY MOUTH DAILY. 90 capsule 0   pravastatin (PRAVACHOL) 20 MG tablet TAKE 1 TABLET BY MOUTH EVERY DAY 90 tablet 0   sucralfate (CARAFATE) 1 GM/10ML suspension TAKE 10 MLS (1 G TOTAL) BY MOUTH 2 (TWO) TIMES DAILY. 420 mL 0   sucralfate (CARAFATE) 1 GM/10ML suspension Take 10 mLs (1 g total) by mouth 2 (two) times daily. 420 mL  0   traZODone (DESYREL) 150 MG tablet TAKE 1 TABLET BY MOUTH AT BEDTIME. 90 tablet 0   No current facility-administered medications on file prior to visit.    Allergies  Allergen Reactions   Alprazolam Hives   Alprazolam Hives   Crestor [Rosuvastatin Calcium] Other (See Comments)    Joint pain    Social History   Socioeconomic History   Marital status: Married    Spouse name: Not on file   Number of children: Not on file   Years of education: Not on file   Highest education level: Not on file  Occupational History   Not on file  Tobacco Use   Smoking status: Some Days    Current packs/day: 0.25    Types: Cigarettes    Passive exposure: Current   Smokeless tobacco: Never   Tobacco comments:    No smoking today 07/01/22  Vaping Use   Vaping status: Never Used  Substance and Sexual Activity   Alcohol use: Yes    Alcohol/week: 16.0 standard drinks of alcohol    Types: 16 Standard drinks or equivalent per week    Comment: 1/2 pt. daily   Drug use: Not Currently    Types: Marijuana   Sexual activity: Not on file  Other Topics Concern   Not on file  Social History Narrative   Not on file   Social Determinants of Health   Financial Resource Strain:  Not on file  Food Insecurity: Not on file  Transportation Needs: Not on file  Physical Activity: Not on file  Stress: Not on file  Social Connections: Not on file  Intimate Partner Violence: Not on file    Family History  Problem Relation Age of Onset   Other Mother        lou gehrigs disease   Breast cancer Sister    Aneurysm Sister    Cancer Paternal Grandmother    Colon cancer Neg Hx    Colon polyps Neg Hx    Crohn's disease Neg Hx    Esophageal cancer Neg Hx    Rectal cancer Neg Hx    Stomach cancer Neg Hx     Past Surgical History:  Procedure Laterality Date   COLONOSCOPY  2012   TUBAL LIGATION      ROS: Review of Systems Negative except as stated above  PHYSICAL EXAM: BP 125/83   Pulse 72    Temp 98.3 F (36.8 C) (Oral)   Ht 5\' 5"  (1.651 m)   Wt 148 lb (67.1 kg)   SpO2 95%   BMI 24.63 kg/m   Physical Exam HENT:     Head: Normocephalic and atraumatic.     Nose: Nose normal.     Mouth/Throat:     Mouth: Mucous membranes are moist.     Pharynx: Oropharynx is clear.  Eyes:     Extraocular Movements: Extraocular movements intact.     Conjunctiva/sclera: Conjunctivae normal.     Pupils: Pupils are equal, round, and reactive to light.  Cardiovascular:     Rate and Rhythm: Normal rate and regular rhythm.     Pulses: Normal pulses.     Heart sounds: Normal heart sounds.  Pulmonary:     Effort: Pulmonary effort is normal.     Breath sounds: Normal breath sounds.  Abdominal:     General: Bowel sounds are normal.     Palpations: Abdomen is soft.  Musculoskeletal:        General: Normal range of motion.     Cervical back: Normal range of motion and neck supple.  Neurological:     General: No focal deficit present.     Mental Status: She is alert and oriented to person, place, and time.  Psychiatric:        Mood and Affect: Mood normal.        Behavior: Behavior normal.     ASSESSMENT AND PLAN: 1. Abdominal pain, unspecified abdominal location - Continue present management. No refills needed as of present.  - Routine screening.  - Keep all scheduled appointments with Gastroenterology. - Follow-up with primary provider as scheduled.  - Basic Metabolic Panel    Patient was given the opportunity to ask questions.  Patient verbalized understanding of the plan and was able to repeat key elements of the plan. Patient was given clear instructions to go to Emergency Department or return to medical center if symptoms don't improve, worsen, or new problems develop.The patient verbalized understanding.   Orders Placed This Encounter  Procedures   Basic Metabolic Panel    Return for Follow-Up or next available with Georganna Skeans, MD.  Rema Fendt, NP

## 2023-10-21 NOTE — Progress Notes (Signed)
Patient states no other concerns to discuss.

## 2023-10-22 LAB — BASIC METABOLIC PANEL
BUN/Creatinine Ratio: 14 (ref 12–28)
BUN: 11 mg/dL (ref 8–27)
CO2: 23 mmol/L (ref 20–29)
Calcium: 10.2 mg/dL (ref 8.7–10.3)
Chloride: 102 mmol/L (ref 96–106)
Creatinine, Ser: 0.78 mg/dL (ref 0.57–1.00)
Glucose: 88 mg/dL (ref 70–99)
Potassium: 3.9 mmol/L (ref 3.5–5.2)
Sodium: 144 mmol/L (ref 134–144)
eGFR: 85 mL/min/{1.73_m2} (ref >59)

## 2023-10-29 NOTE — Addendum Note (Signed)
Addended by: Guy Franco on: 10/29/2023 12:11 PM   Modules accepted: Orders

## 2023-11-22 ENCOUNTER — Other Ambulatory Visit: Payer: Self-pay | Admitting: Family

## 2023-11-22 DIAGNOSIS — M79601 Pain in right arm: Secondary | ICD-10-CM

## 2023-11-24 ENCOUNTER — Other Ambulatory Visit: Payer: Self-pay

## 2023-11-24 ENCOUNTER — Other Ambulatory Visit: Payer: Self-pay | Admitting: Family Medicine

## 2023-11-24 DIAGNOSIS — M79601 Pain in right arm: Secondary | ICD-10-CM

## 2023-11-24 MED ORDER — MELOXICAM 7.5 MG PO TABS: 7.5000 mg | ORAL_TABLET | Freq: Every day | ORAL | 0 refills | Status: DC

## 2023-11-28 ENCOUNTER — Other Ambulatory Visit: Payer: Self-pay | Admitting: Family Medicine

## 2023-12-01 ENCOUNTER — Ambulatory Visit: Payer: BLUE CROSS/BLUE SHIELD

## 2023-12-01 DIAGNOSIS — E7800 Pure hypercholesterolemia, unspecified: Secondary | ICD-10-CM

## 2023-12-01 NOTE — Progress Notes (Signed)
Patient came in for labs Patient tolerated well

## 2023-12-02 LAB — LIPID PANEL
Chol/HDL Ratio: 3.8 {ratio} (ref 0.0–4.4)
Cholesterol, Total: 222 mg/dL — ABNORMAL HIGH (ref 100–199)
HDL: 58 mg/dL (ref >39)
LDL Chol Calc (NIH): 97 mg/dL (ref 0–99)
Triglycerides: 407 mg/dL — ABNORMAL HIGH (ref 0–149)
VLDL Cholesterol Cal: 67 mg/dL — ABNORMAL HIGH (ref 5–40)

## 2023-12-03 ENCOUNTER — Other Ambulatory Visit: Payer: Self-pay | Admitting: Family Medicine

## 2023-12-05 ENCOUNTER — Other Ambulatory Visit: Payer: Self-pay | Admitting: Family Medicine

## 2023-12-05 NOTE — Telephone Encounter (Signed)
Complete

## 2023-12-19 ENCOUNTER — Ambulatory Visit
Admission: RE | Admit: 2023-12-19 | Discharge: 2023-12-19 | Disposition: A | Discharge status: Home / Self Care | Payer: BLUE CROSS/BLUE SHIELD | Source: Ambulatory Visit | Attending: Family Medicine | Admitting: Family Medicine

## 2023-12-19 DIAGNOSIS — Z1231 Encounter for screening mammogram for malignant neoplasm of breast: Secondary | ICD-10-CM

## 2024-01-28 IMAGING — MR MR HEAD W/O CM
10 series · 48 of 48 positions shown · non-contrast
Comparison: None Available.

CLINICAL DATA: TIA

EXAM:
MRI HEAD WITHOUT CONTRAST
TECHNIQUE: Multiplanar, multiecho pulse sequences of the brain and surrounding
structures were obtained without intravenous contrast.

[Series 5: DWI · axial · 3.0mm · 1.36mm/px · z∈[-66,+73]mm · 10 of 96 slices shown (1 of 2)]
[im 1/96]
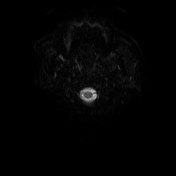
[im 11/96]
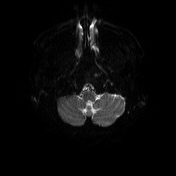
[im 22/96]
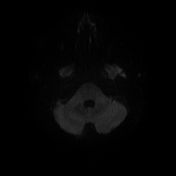
[im 32/96]
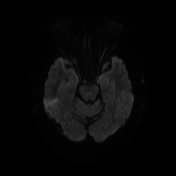
[im 43/96]
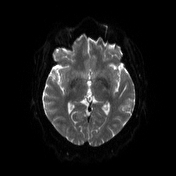
[im 53/96]
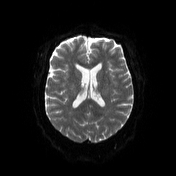
[im 64/96]
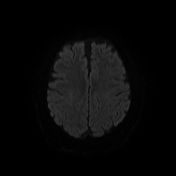
[im 74/96]
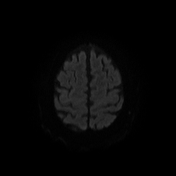
[im 85/96]
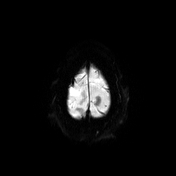
[im 96/96]
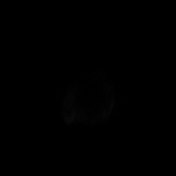

[Series 6: DWI · axial · 3.0mm · 1.36mm/px · z∈[-66,+73]mm · 4 of 48 slices shown (2 of 2)]
[im 1/48]
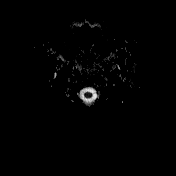
[im 16/48]
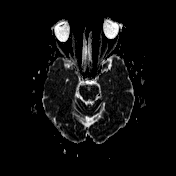
[im 32/48]
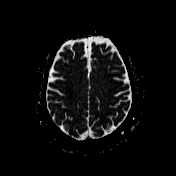
[im 48/48]
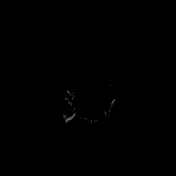

[Series 7: T1 · sagittal · 5.0mm · 0.75mm/px · 2 of 24 slices shown (1 of 2)]
[im 1/24]
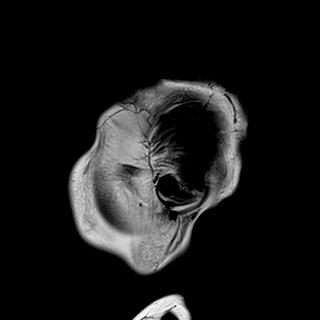
[im 24/24]
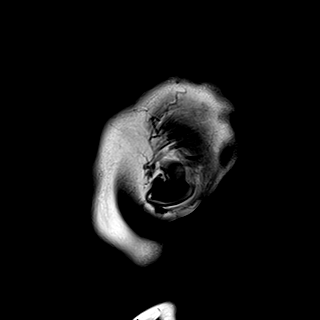

[Series 8: T2 · axial · 5.0mm · 0.62mm/px · z∈[-76,+85]mm · 2 of 26 slices shown (1 of 2)]
[im 1/26]
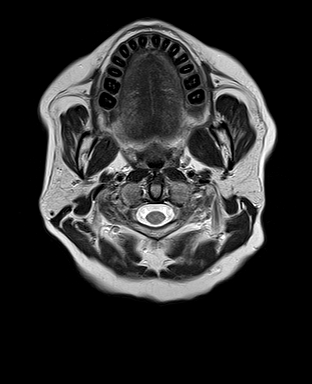
[im 26/26]
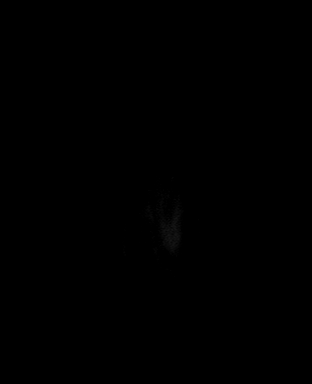

[Series 9: swi_images · axial · 3.0mm · 0.75mm/px · z∈[-82,+93]mm · 5 of 60 slices shown]
[im 1/60]
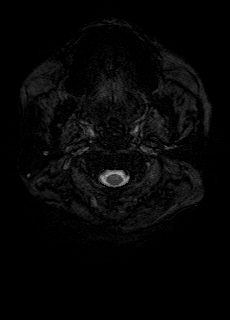
[im 15/60]
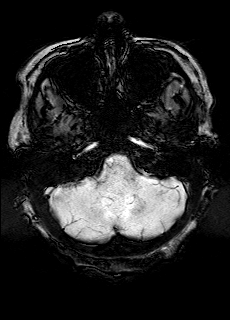
[im 30/60]
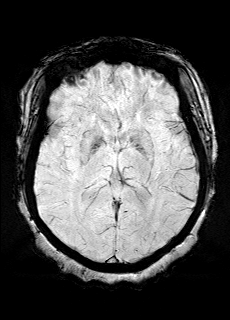
[im 45/60]
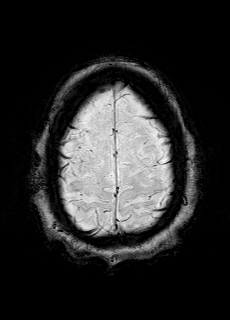
[im 60/60]
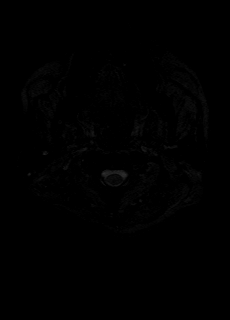

[Series 11: FLAIR · axial · 3.0mm · 0.75mm/px · z∈[-71,+78]mm · 2 of 26 slices shown]
[im 1/26]
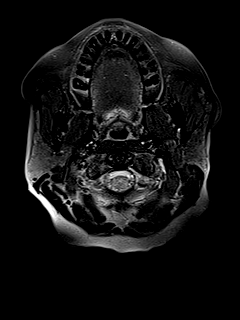
[im 26/26]
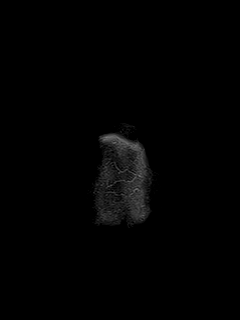

[Series 12: T1 · axial · 1.0mm · 0.94mm/px · z∈[-66,+76]mm · 13 of 144 slices shown (2 of 2)]
[im 1/144]
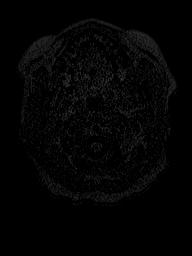
[im 12/144]
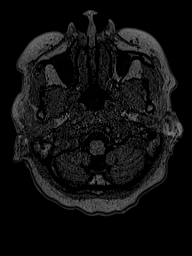
[im 24/144]
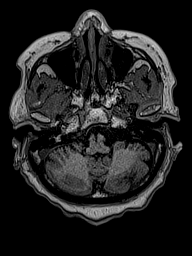
[im 36/144]
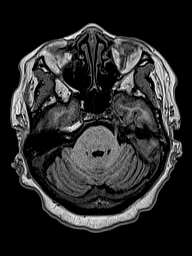
[im 48/144]
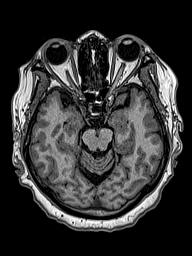
[im 60/144]
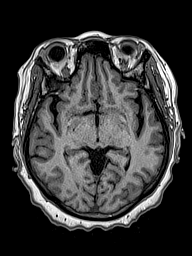
[im 72/144]
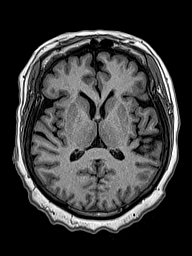
[im 84/144]
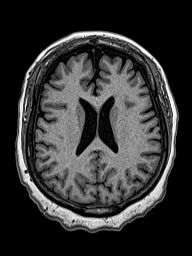
[im 96/144]
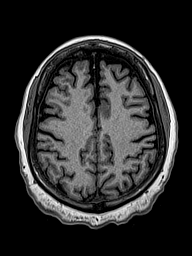
[im 108/144]
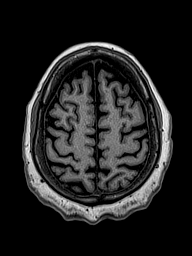
[im 120/144]
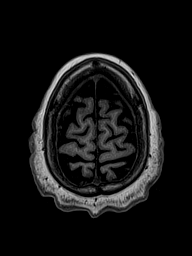
[im 132/144]
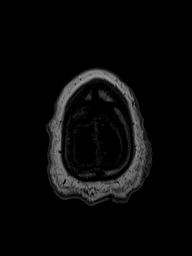
[im 144/144]
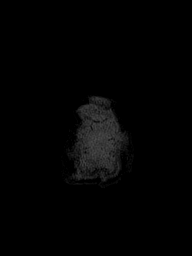

[Series 13: cor dwi_tracew · coronal · 5.0mm · 1.53mm/px · 5 of 50 slices shown]
[im 1/50]
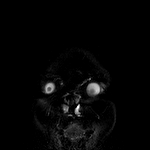
[im 13/50]
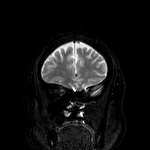
[im 25/50]
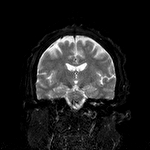
[im 37/50]
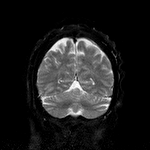
[im 50/50]
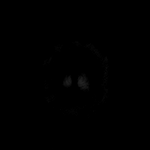

[Series 14: cor dwi_adc · coronal · 5.0mm · 1.53mm/px · 2 of 25 slices shown]
[im 1/25]
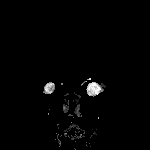
[im 25/25]
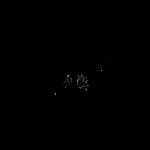

[Series 15: T2 · coronal · 5.0mm · 0.57mm/px · 3 of 32 slices shown (2 of 2)]
[im 1/32]
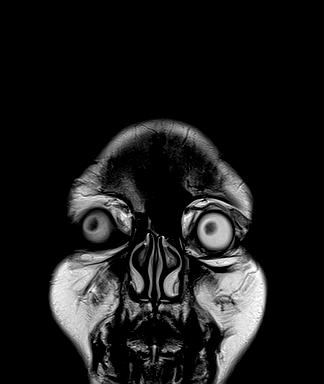
[im 16/32]
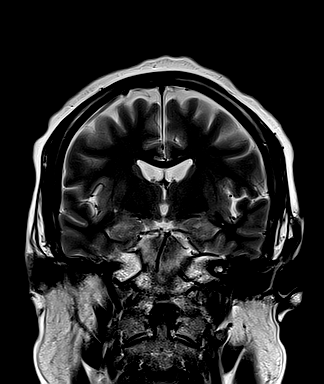
[im 32/32]
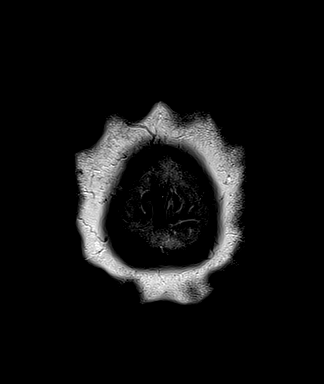

[48 of 48 positions shown; findings below may reference images not displayed]

FINDINGS: Brain: There is no acute infarction or intracranial hemorrhage.
There is no intracranial mass, mass effect, or edema. There is no
hydrocephalus or extra-axial fluid collection. Ventricles and sulci
are normal in size and configuration.

Vascular: Major vessel flow voids at the skull base are preserved.

Skull and upper cervical spine: Normal marrow signal is preserved.

Sinuses/Orbits: Paranasal sinuses are aerated. Orbits are
unremarkable.

Other: Sella is unremarkable.  Mastoid air cells are clear.
IMPRESSION: No evidence of recent infarction, hemorrhage, or mass.

## 2024-01-30 ENCOUNTER — Ambulatory Visit: Payer: BLUE CROSS/BLUE SHIELD | Admitting: Gastroenterology

## 2024-02-10 ENCOUNTER — Telehealth: Payer: Self-pay | Admitting: Family Medicine

## 2024-02-10 NOTE — Telephone Encounter (Signed)
Copied from CRM [id number]. Topic: Referral - Request for Referral >> Feb 10, 2024 10:57 AM Phill Myron wrote: Pt. Teresa Cabrera is calling to ask if she can be referred to another [place] doctor, for a sooner appt. Please advise

## 2024-02-12 NOTE — Telephone Encounter (Signed)
Patient is scheduled for GI appt on 03/23/2024

## 2024-03-03 ENCOUNTER — Other Ambulatory Visit: Payer: Self-pay

## 2024-03-03 ENCOUNTER — Other Ambulatory Visit: Payer: Self-pay | Admitting: Family

## 2024-03-03 DIAGNOSIS — E7800 Pure hypercholesterolemia, unspecified: Secondary | ICD-10-CM

## 2024-03-03 NOTE — Telephone Encounter (Signed)
 Requested Prescriptions  Pending Prescriptions Disp Refills   pravastatin (PRAVACHOL) 20 MG tablet [Pharmacy Med Name: PRAVASTATIN SODIUM 20 MG TAB] 90 tablet 0    Sig: TAKE 1 TABLET BY MOUTH EVERY DAY     Cardiovascular:  Antilipid - Statins Failed - 03/03/2024  2:27 PM      Failed - Lipid Panel in normal range within the last 12 months    Cholesterol, Total  Date Value Ref Range Status  12/01/2023 222 (H) 100 - 199 mg/dL Final   LDL Chol Calc (NIH)  Date Value Ref Range Status  12/01/2023 97 0 - 99 mg/dL Final   HDL  Date Value Ref Range Status  12/01/2023 58 >39 mg/dL Final   Triglycerides  Date Value Ref Range Status  12/01/2023 407 (H) 0 - 149 mg/dL Final         Passed - Patient is not pregnant      Passed - Valid encounter within last 12 months    Recent Outpatient Visits           4 months ago Abdominal pain, unspecified abdominal location   [facility], Amy J, NP   5 months ago Abdominal pain, unspecified abdominal location   [facility], Amy J, NP   10 months ago Acute cystitis with hematuria   [place] [facility], MD   1 year ago Acute cystitis with hematuria   [place] [facility], MD   1 year ago Essential hypertension   [place] [facility], MD

## 2024-03-23 ENCOUNTER — Ambulatory Visit (INDEPENDENT_AMBULATORY_CARE_PROVIDER_SITE_OTHER): Payer: BLUE CROSS/BLUE SHIELD | Admitting: Gastroenterology

## 2024-03-23 ENCOUNTER — Other Ambulatory Visit: Payer: Self-pay | Admitting: Gastroenterology

## 2024-03-23 ENCOUNTER — Encounter: Payer: Self-pay | Admitting: Gastroenterology

## 2024-03-23 VITALS — BP 116/70 | HR 72 | Ht 65.0 in | Wt 152.0 lb

## 2024-03-23 DIAGNOSIS — F1090 Alcohol use, unspecified, uncomplicated: Secondary | ICD-10-CM

## 2024-03-23 DIAGNOSIS — N2889 Other specified disorders of kidney and ureter: Secondary | ICD-10-CM

## 2024-03-23 DIAGNOSIS — M549 Dorsalgia, unspecified: Secondary | ICD-10-CM | POA: Diagnosis not present

## 2024-03-23 DIAGNOSIS — K219 Gastro-esophageal reflux disease without esophagitis: Secondary | ICD-10-CM | POA: Diagnosis not present

## 2024-03-23 DIAGNOSIS — R109 Unspecified abdominal pain: Secondary | ICD-10-CM | POA: Diagnosis not present

## 2024-03-23 NOTE — Patient Instructions (Addendum)
 Continue Omeprazole 40 mg po daily. Recommend Alcohol cessation. Engage in regular exercise as tolerated.  You have been scheduled for an abdominal ultrasound at [facility] (1st floor of hospital) on Monday, 03/29/24 at 8:30am. Please arrive 15 minutes prior to your appointment for registration. Make certain not to have anything to eat or drink after midnight prior to your appointment. Should you need to reschedule your appointment, please contact radiology at [phone]. This test typically takes about 30 minutes to perform.  Thank you for trusting me with your gastrointestinal care!   Deanna May, NP     _______________________________________________________  If your blood pressure at your visit was 140/90 or greater, please contact your primary care physician to follow up on this.  _______________________________________________________  If you are age 48 or older, your body mass index should be between 23-30. Your Body mass index is 25.29 kg/m. If this is out of the aforementioned range listed, please consider follow up with your Primary Care Provider.  If you are age 5 or younger, your body mass index should be between 19-25. Your Body mass index is 25.29 kg/m. If this is out of the aformentioned range listed, please consider follow up with your Primary Care Provider.   ________________________________________________________  The [facility] providers would like to encourage you to use [facility] to communicate with providers for non-urgent requests or questions.  Due to long hold times on the telephone, sending your provider a message by [facility] may be a faster and more efficient way to get a response.  Please allow 48 business hours for a response.  Please remember that this is for non-urgent requests.  _______________________________________________________

## 2024-03-23 NOTE — Progress Notes (Signed)
 Chief Complaint: Abdominal pain Primary GI Doctor:(previously Dr. Russella Dar) Dr Chales Abrahams  HPI:  Patient is a  63  year old female patient with past medical history of anxiety, depression, GERD, hyperlipidemia, and hypertension, who was referred to me by Georganna Skeans, MD on 09/18/2023 for a complaint of abdominal pain.    07/01/2022 patient last seen in GI office with Dr. Russella Dar for colonoscopy screening, history of polyps. Impression:  - Two 7 to 9 mm polyps in the descending colon and in the transverse colon, removed with a cold snare. Resected and retrieved.  - Moderate diverticulosis in the sigmoid colon, in the descending colon and in the transverse colon. - The examination was otherwise normal on direct and retroflexion views. Path:  Surgical [P], colon, descending and transverse, polyp (2) FINDINGS CONSISTENT WITH TUBULAR ADENOMA X 2. NO HIGH-GRADE DYSPLASIA OR MALIGNANCY IS SEEN. CLINICAL AND ENDOSCOPIC CORRELATION IS REQUIRED.   Interval History     Patient presents today today to follow-up on both acute and chronic issues she has been experiencing the first issue is of chronic left sided/back pain she states she has had several years. She states the pain is worse if she lies on that side for extended periods of time. She states if she is sitting up or walking it doesn't hurt. 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 She denies hip or back problems. She denies urination problems. The pain is not worse with eating. She states the pain is unrelated to her bowel habits.     Secondly she wants to follow-up on acute episode of epigastric pain she had back in September. She states due to the time that lapsed before she could get into office the symptoms have resolved.    She has history of GERD that is controlled with Omeprazole 40 mg po daily. No dysphagia.  Patient denies nausea, vomiting, or weight loss.  She has daily bowel movement. No blood in stool.   Daily  smoker, tobacco 20 years.   She drinks 1/2 pint of Grey [employer] vodka per day. She is trying to reduce intake.  NSAIDs prn, very seldom.  Wt Readings from Last 3 Encounters:  03/23/24 152 lb (68.9 kg)  10/21/23 148 lb (67.1 kg)  09/18/23 147 lb (66.7 kg)    Past Medical History:  Diagnosis Date   Anemia    Anxiety    Depression    GERD (gastroesophageal reflux disease)    Hyperlipidemia    Hypertension    Insomnia    Substance abuse (HCC)     Past Surgical History:  Procedure Laterality Date   COLONOSCOPY  2012   TUBAL LIGATION      Current Outpatient Medications  Medication Sig Dispense Refill   aspirin EC 81 MG tablet Take 81 mg by mouth daily. Swallow whole.     atenolol-chlorthalidone (TENORETIC) 50-25 MG tablet Take 1 tablet by mouth daily. 90 tablet 1   KLOR-CON M20 20 MEQ tablet TAKE 1 TABLET BY MOUTH TWICE A DAY 180 tablet 1   meloxicam (MOBIC) 7.5 MG tablet TAKE 1 TABLET BY MOUTH EVERY DAY 30 tablet 0   meloxicam (MOBIC) 7.5 MG tablet Take 1 tablet (7.5 mg total) by mouth daily. 30 tablet 0   omeprazole (PRILOSEC) 40 MG capsule TAKE 1 CAPSULE (40 MG TOTAL) BY MOUTH DAILY. 90 capsule 1   pravastatin (PRAVACHOL) 20 MG tablet TAKE 1 TABLET BY MOUTH EVERY DAY 90 tablet 0  sucralfate (CARAFATE) 1 GM/10ML suspension TAKE 10 MLS (1 G TOTAL) BY MOUTH 2 (TWO) TIMES DAILY. 420 mL 0   sucralfate (CARAFATE) 1 GM/10ML suspension Take 10 mLs (1 g total) by mouth 2 (two) times daily. 420 mL 0   traZODone (DESYREL) 150 MG tablet TAKE 1 TABLET BY MOUTH AT BEDTIME. (Patient not taking: Reported on 03/23/2024) 90 tablet 0   No current facility-administered medications for this visit.    Allergies as of 03/23/2024 - Review Complete 03/23/2024  Allergen Reaction Noted   Alprazolam Hives    Alprazolam Hives 01/29/2022   Crestor [rosuvastatin calcium] Other (See Comments) 08/16/2011    Family History  Problem Relation Age of Onset   Other Mother        lou gehrigs disease    Breast cancer Sister    Aneurysm Sister    Cancer Paternal Grandmother    Colon cancer Neg Hx    Colon polyps Neg Hx    Crohn's disease Neg Hx    Esophageal cancer Neg Hx    Rectal cancer Neg Hx    Stomach cancer Neg Hx     Review of Systems:    Constitutional: No weight loss, fever, chills, weakness or fatigue HEENT: Eyes: No change in vision               Ears, Nose, Throat:  No change in hearing or congestion Skin: No rash or itching Cardiovascular: No chest pain, chest pressure or palpitations   Respiratory: No SOB or cough Gastrointestinal: See HPI and otherwise negative Genitourinary: No dysuria or change in urinary frequency Neurological: No headache, dizziness or syncope Musculoskeletal: No new muscle or joint pain Hematologic: No bleeding or bruising Psychiatric: No history of depression or anxiety    Physical Exam:  Vital signs: BP 116/70   Pulse 72   Ht 5\' 5"  (1.651 m)   Wt 152 lb (68.9 kg)   BMI 25.29 kg/m   Constitutional:   Pleasant female appears to be in NAD, Well developed, Well nourished, alert and cooperative Throat: Oral cavity and pharynx without inflammation, swelling or lesion.  Respiratory: Respirations even and unlabored. Lungs clear to auscultation bilaterally.   No wheezes, crackles, or rhonchi.  Cardiovascular: Normal S1, S2. Regular rate and rhythm. No peripheral edema, cyanosis or pallor.  Gastrointestinal:  Soft, nondistended, nontender. No rebound or guarding. Hypoactive bowel sounds. No appreciable masses or hepatomegaly. Rectal:  Not performed.  Msk:  Symmetrical without gross deformities. Without edema, no deformity or joint abnormality.  Neurologic:  Alert and  oriented x4;  grossly normal neurologically.  Skin:   Dry and intact without significant lesions or rashes. Psychiatric: Oriented to person, place and time. Demonstrates good judgement and reason without abnormal affect or behaviors.  RELEVANT LABS AND IMAGING: CBC    Latest  Ref Rng & Units 07/31/2023    5:05 PM 06/10/2022   10:36 AM 06/10/2022   10:28 AM  CBC  WBC 4.0 - 10.5 K/uL 6.1   4.6   Hemoglobin 12.0 - 15.0 g/dL 91.4  78.2  95.6   Hematocrit 36.0 - 46.0 % 40.1  45.0  45.5   Platelets 150 - 400 K/uL 372   412      CMP     Latest Ref Rng & Units 10/21/2023   10:54 AM 09/18/2023   11:45 AM 07/31/2023    5:05 PM  CMP  Glucose 70 - 99 mg/dL 88  91  213   BUN 8 - 27  mg/dL 11  16  15    Creatinine 0.57 - 1.00 mg/dL 9.14  7.82  9.56   Sodium 134 - 144 mmol/L 144  143  138   Potassium 3.5 - 5.2 mmol/L 3.9  4.3  2.8   Chloride 96 - 106 mmol/L 102  105  104   CO2 20 - 29 mmol/L 23  19  25    Calcium 8.7 - 10.3 mg/dL 21.3  9.7  9.3   Total Protein 6.0 - 8.5 g/dL  7.4  7.6   Total Bilirubin 0.0 - 1.2 mg/dL  0.3  0.4   Alkaline Phos 44 - 121 IU/L  52  43   AST 0 - 40 IU/L  24  32   ALT 0 - 32 IU/L  20  26      Lab Results  Component Value Date   TSH 1.630 01/29/2022  09/10/2011 colonoscopy with Dr. Russella Dar for IDA and abdominal pain Endoscopic impression: Diverticula scattered in the transverse colon Mild diverticulosis 5 mm sigmoid colon polyp  Assessment:    63 year old female patient that presents to follow-up on epigastric pain she had back in September that has since then resolved. We spent several minutes discussing her diet with elevated TRG and alcohol use due to concerns for risk with pancreatitis. She had amylase and lipase checked back in September which was normal. Will order Abd u/s to evaluate liver and gallbladder. The left sided abdominal/back pain I suspect is more MSK etiology given it is related to position and not eating. Recommended she discuss with PCP and Jaymian Bogart benefit from PT. Plan: - Continue Omeprazole 40 mg po daily. - Order Abdominal US, complete  -Recommend Alcohol cessation -recall colonoscopy 06/2027  Thank you for the courtesy of this consult. Please call me with any questions or concerns.   Meliyah Simon, FNP-C [facility] 03/23/2024, 4:28 PM  Cc: Georganna Skeans, MD

## 2024-03-29 ENCOUNTER — Ambulatory Visit (HOSPITAL_COMMUNITY)
Admission: RE | Admit: 2024-03-29 | Discharge: 2024-03-29 | Disposition: A | Discharge status: Home / Self Care | Source: Ambulatory Visit | Attending: Gastroenterology | Admitting: Gastroenterology

## 2024-03-29 DIAGNOSIS — R109 Unspecified abdominal pain: Secondary | ICD-10-CM | POA: Diagnosis present

## 2024-03-31 NOTE — Addendum Note (Signed)
 Addended by: Quentin Mulling on: 03/31/2024 09:35 AM   Modules accepted: Orders

## 2024-03-31 NOTE — Progress Notes (Signed)
 Patient had abdominal ultrasound 03/29/2024 for discomfort aching pain.  Ultrasound showed fatty liver which I discussed with the patient's, will need CBC liver function checked every 6 months. However it also showed 4.1 cm left parenchymal mass. Discussed with the patient, please set up CT renal protocol for after April 24, patient is going on vacation today. Answered all of her questions.

## 2024-04-23 ENCOUNTER — Ambulatory Visit (HOSPITAL_COMMUNITY)
Admission: RE | Admit: 2024-04-23 | Discharge: 2024-04-23 | Disposition: A | Discharge status: Home / Self Care | Source: Ambulatory Visit | Attending: Physician Assistant | Admitting: Physician Assistant

## 2024-04-23 DIAGNOSIS — N2889 Other specified disorders of kidney and ureter: Secondary | ICD-10-CM | POA: Diagnosis present

## 2024-04-23 MED ORDER — SODIUM CHLORIDE (PF) 0.9 % IJ SOLN
INTRAMUSCULAR | Status: AC
  Filled 2024-04-23: qty 50

## 2024-04-23 MED ORDER — IOHEXOL 300 MG/ML  SOLN
100.0000 mL | Freq: Once | INTRAMUSCULAR | Status: AC | PRN
Start: 2024-04-23 — End: 2024-04-23
  Administered 2024-04-23: 100 mL via INTRAVENOUS

## 2024-05-29 ENCOUNTER — Encounter (HOSPITAL_COMMUNITY): Payer: Self-pay

## 2024-05-29 ENCOUNTER — Ambulatory Visit (HOSPITAL_COMMUNITY)
Admission: RE | Admit: 2024-05-29 | Discharge: 2024-05-29 | Disposition: A | Discharge status: Home / Self Care | Source: Ambulatory Visit | Attending: Emergency Medicine | Admitting: Emergency Medicine

## 2024-05-29 VITALS — BP 152/95 | HR 70 | Temp 98.2°F | Resp 16

## 2024-05-29 DIAGNOSIS — M62830 Muscle spasm of back: Secondary | ICD-10-CM

## 2024-05-29 MED ORDER — BACLOFEN 10 MG PO TABS: 10.0000 mg | ORAL_TABLET | Freq: Three times a day (TID) | ORAL | 0 refills | Status: DC

## 2024-05-29 MED ORDER — LIDOCAINE 4 % EX PTCH: 1.0000 | MEDICATED_PATCH | CUTANEOUS | 0 refills | Status: AC

## 2024-05-29 NOTE — ED Triage Notes (Signed)
 Patient here today with c/o upper back pain X 1 week. Patient has increased pain with bending. Patient tried taking Meloxicam  with no relief.

## 2024-05-29 NOTE — ED Provider Notes (Signed)
 [facility]    CSN: [id number] Arrival date & time: 05/29/24  1055      History   Chief Complaint Chief Complaint  Patient presents with   Back Pain    HPI Teresa Cabrera is a 63 y.o. female.   Patient presents with left upper back pain x 1 week.  Patient describes the pain as tightness and spasming.  Patient states that she has increased pain with bending and moving.    Patient reports that she did take meloxicam  without relief.  Patient states that she has chronic bilateral knee pain and takes meloxicam  for this and it will relieve her knee pain, but states that it did not help with her back pain.  Patient denies any recent falls or known injuries.  Denies chest pain, shortness of breath, abdominal pain, radiating numbness or tingling.  The history is provided by the patient and medical records.  Back Pain   Past Medical History:  Diagnosis Date   Anemia    Anxiety    Depression    GERD (gastroesophageal reflux disease)    Hyperlipidemia    Hypertension    Insomnia    Substance abuse (HCC)     Patient Active Problem List   Diagnosis Date Noted   Bad odor of urine 02/03/2023   Encounter for IUD removal 12/28/2019   Abnormal mammogram of right breast 06/10/2016   Family history of breast cancer in sister 06/10/2016   Nonspecific immunological findings 02/04/2014   Hypercholesterolemia 01/18/2014   Vitamin D deficiency 01/18/2014   Nondependent alcohol abuse 02/02/2013   Essential hypertension 02/02/2013   DUB (dysfunctional uterine bleeding) 04/09/2012   Alcohol abuse 04/09/2012   Smoker 04/09/2012   HTN (hypertension) 04/09/2012   Current smoker 04/09/2012   Dysfunctional uterine bleeding 04/09/2012   Current smoker 04/09/2012   Insomnia     Past Surgical History:  Procedure Laterality Date   COLONOSCOPY  2012   TUBAL LIGATION      OB History     Gravida  5   Para  2   Term      Preterm      AB  3   Living  2      SAB       IAB      Ectopic      Multiple      Live Births               Home Medications    Prior to Admission medications   Medication Sig Start Date End Date Taking? Authorizing Provider  baclofen (LIORESAL) 10 MG tablet Take 1 tablet (10 mg total) by mouth 3 (three) times daily. 05/29/24  Yes Dominika Losey A, NP  lidocaine 4 % Place 1 patch onto the skin daily. 05/29/24  Yes Levora Reas A, NP  aspirin EC 81 MG tablet Take 81 mg by mouth daily. Swallow whole.    [provider]  atenolol -chlorthalidone  (TENORETIC ) 50-25 MG tablet Take 1 tablet by mouth daily. 09/18/23   Abraham Abo, MD  KLOR-CON  M20 20 MEQ tablet TAKE 1 TABLET BY MOUTH TWICE A DAY 11/25/23   Abraham Abo, MD  meloxicam  (MOBIC ) 7.5 MG tablet TAKE 1 TABLET BY MOUTH EVERY DAY 11/24/23   Senaida Dama, NP  meloxicam  (MOBIC ) 7.5 MG tablet Take 1 tablet (7.5 mg total) by mouth daily. 11/24/23   Senaida Dama, NP  omeprazole  (PRILOSEC) 40 MG capsule TAKE 1 CAPSULE (40 MG TOTAL) BY MOUTH  DAILY. 12/03/23   Abraham Abo, MD  pravastatin  (PRAVACHOL ) 20 MG tablet TAKE 1 TABLET BY MOUTH EVERY DAY 03/03/24   Senaida Dama, NP  traZODone  (DESYREL ) 150 MG tablet TAKE 1 TABLET BY MOUTH AT BEDTIME. Patient not taking: Reported on 03/23/2024 08/01/23   Senaida Dama, NP    Family History Family History  Problem Relation Age of Onset   Other Mother        lou gehrigs disease   Breast cancer Sister    Aneurysm Sister    Cancer Paternal Grandmother    Colon cancer Neg Hx    Colon polyps Neg Hx    Crohn's disease Neg Hx    Esophageal cancer Neg Hx    Rectal cancer Neg Hx    Stomach cancer Neg Hx     Social History Social History   Tobacco Use   Smoking status: Some Days    Current packs/day: 0.25    Types: Cigarettes    Passive exposure: Current   Smokeless tobacco: Never   Tobacco comments:    No smoking today 07/01/22  Vaping Use   Vaping status: Never Used  Substance Use Topics   Alcohol use: Yes     Alcohol/week: 16.0 standard drinks of alcohol    Types: 16 Standard drinks or equivalent per week    Comment: 1/2 pt. daily   Drug use: Not Currently    Types: Marijuana     Allergies   Alprazolam, Alprazolam, and Crestor [rosuvastatin calcium]   Review of Systems Review of Systems  Musculoskeletal:  Positive for back pain.   Per HPI  Physical Exam Triage Vital Signs ED Triage Vitals  Encounter Vitals Group     BP 05/29/24 1118 (!) 152/95     Systolic BP Percentile --      Diastolic BP Percentile --      Pulse Rate 05/29/24 1118 70     Resp 05/29/24 1118 16     Temp 05/29/24 1118 98.2 F (36.8 C)     Temp Source 05/29/24 1118 Oral     SpO2 05/29/24 1118 96 %     Weight --      Height --      Head Circumference --      Peak Flow --      Pain Score 05/29/24 1120 5     Pain Loc --      Pain Education --      Exclude from Growth Chart --    No data found.  Updated Vital Signs BP (!) 152/95 (BP Location: Right Arm)   Pulse 70   Temp 98.2 F (36.8 C) (Oral)   Resp 16   LMP  (LMP Unknown)   SpO2 96%   Visual Acuity Right Eye Distance:   Left Eye Distance:   Bilateral Distance:    Right Eye Near:   Left Eye Near:    Bilateral Near:     Physical Exam Vitals and nursing note reviewed.  Constitutional:      General: She is awake. She is not in acute distress.    Appearance: Normal appearance. She is well-developed and well-groomed. She is not ill-appearing.  Musculoskeletal:     Cervical back: Normal.     Thoracic back: Spasms and tenderness present. No swelling, edema, deformity, signs of trauma or bony tenderness. Normal range of motion.     Lumbar back: Normal.       Back:     Comments: Tenderness and  spasm noted to left upper thoracic region.  Skin:    General: Skin is warm and dry.  Neurological:     Mental Status: She is alert.  Psychiatric:        Behavior: Behavior is cooperative.      UC Treatments / Results  Labs (all labs ordered  are listed, but only abnormal results are displayed) Labs Reviewed - No data to display  EKG   Radiology No results found.  Procedures Procedures (including critical care time)  Medications Ordered in UC Medications - No data to display  Initial Impression / Assessment and Plan / UC Course  I have reviewed the triage vital signs and the nursing notes.  Pertinent labs & imaging results that were available during my care of the patient were reviewed by me and considered in my medical decision making (see chart for details).     Patient is well-appearing.  Vitals are stable.  Upon assessment there is tenderness in to the left upper thoracic region.  Prescribed baclofen as needed for muscle pain and spasms.  Prescribed lidocaine patches for additional pain relief.  Discussed follow-up and return precautions. Final Clinical Impressions(s) / UC Diagnoses   Final diagnoses:  Spasm of thoracic back muscle     Discharge Instructions      Take baclofen up to 3 times daily to help with muscle pain and spasms.  This is a muscle relaxer and has a potential to make you drowsy.  Do not drive, work, or drink alcohol while taking this. Apply lidocaine patch for 12 hours at a time once daily for additional pain relief. You can continue to take your prescribed meloxicam  once daily. You can also take 650 mg of Tylenol every 6-8 hours as needed for breakthrough pain. Alternate between ice and heat as needed for pain.  Do some gentle stretching to avoid becoming stiff. Follow-up with your primary care provider or return here as needed.  ED Prescriptions     Medication Sig Dispense Auth. Provider   baclofen (LIORESAL) 10 MG tablet Take 1 tablet (10 mg total) by mouth 3 (three) times daily. 30 each Levora Reas A, NP   lidocaine 4 % Place 1 patch onto the skin daily. 20 patch Levora Reas A, NP      PDMP not reviewed this encounter.   Levora Reas A, NP 05/29/24 1227

## 2024-05-29 NOTE — Discharge Instructions (Addendum)
 Take baclofen up to 3 times daily to help with muscle pain and spasms.  This is a muscle relaxer and has a potential to make you drowsy.  Do not drive, work, or drink alcohol while taking this. Apply lidocaine patch for 12 hours at a time once daily for additional pain relief. You can continue to take your prescribed meloxicam  once daily. You can also take 650 mg of Tylenol every 6-8 hours as needed for breakthrough pain. Alternate between ice and heat as needed for pain.  Do some gentle stretching to avoid becoming stiff. Follow-up with your primary care provider or return here as needed.

## 2024-06-10 ENCOUNTER — Other Ambulatory Visit: Payer: Self-pay | Admitting: Family Medicine

## 2024-06-10 ENCOUNTER — Other Ambulatory Visit: Payer: Self-pay | Admitting: Family

## 2024-06-22 ENCOUNTER — Other Ambulatory Visit: Payer: Self-pay | Admitting: Family Medicine

## 2024-06-26 ENCOUNTER — Other Ambulatory Visit: Payer: Self-pay | Admitting: Family Medicine

## 2024-06-28 ENCOUNTER — Ambulatory Visit (INDEPENDENT_AMBULATORY_CARE_PROVIDER_SITE_OTHER): Admitting: Family

## 2024-06-28 ENCOUNTER — Ambulatory Visit: Payer: Self-pay

## 2024-06-28 ENCOUNTER — Encounter: Payer: Self-pay | Admitting: Family

## 2024-06-28 VITALS — BP 139/85 | HR 69 | Temp 98.3°F | Resp 16 | Ht 65.0 in | Wt 150.6 lb

## 2024-06-28 DIAGNOSIS — K219 Gastro-esophageal reflux disease without esophagitis: Secondary | ICD-10-CM | POA: Diagnosis not present

## 2024-06-28 DIAGNOSIS — E876 Hypokalemia: Secondary | ICD-10-CM

## 2024-06-28 MED ORDER — OMEPRAZOLE 40 MG PO CPDR: 40.0000 mg | DELAYED_RELEASE_CAPSULE | Freq: Every day | ORAL | 0 refills | Status: DC

## 2024-06-28 MED ORDER — POTASSIUM CHLORIDE CRYS ER 20 MEQ PO TBCR: 20.0000 meq | EXTENDED_RELEASE_TABLET | Freq: Two times a day (BID) | ORAL | 0 refills | Status: DC

## 2024-06-28 NOTE — Telephone Encounter (Signed)
Noted appointment today

## 2024-06-28 NOTE — Progress Notes (Signed)
 Patient ID: Teresa Cabrera, female    DOB: 1961/11/24  MRN: [id number]  CC: Medication Refills  Subjective: Teresa Cabrera is a 63 y.o. female who presents for medication refills.   Her concerns today include:  - States she needs refills of Potassium. States she has not taken in 2 days and thinks this is causing her lightheadedness. Denies red flag symptoms. States she has been eating bananas to help with potassium.  - States she needs refills of Omeprazole .  Patient Active Problem List   Diagnosis Date Noted   Bad odor of urine 02/03/2023   Encounter for IUD removal 12/28/2019   Abnormal mammogram of right breast 06/10/2016   Family history of breast cancer in sister 06/10/2016   Nonspecific immunological findings 02/04/2014   Hypercholesterolemia 01/18/2014   Vitamin D deficiency 01/18/2014   Nondependent alcohol abuse 02/02/2013   Essential hypertension 02/02/2013   DUB (dysfunctional uterine bleeding) 04/09/2012   Alcohol abuse 04/09/2012   Smoker 04/09/2012   HTN (hypertension) 04/09/2012   Current smoker 04/09/2012   Dysfunctional uterine bleeding 04/09/2012   Current smoker 04/09/2012   Insomnia      Current Outpatient Medications on File Prior to Visit  Medication Sig Dispense Refill   atenolol -chlorthalidone  (TENORETIC ) 50-25 MG tablet Take 1 tablet by mouth daily. 90 tablet 1   baclofen  (LIORESAL ) 10 MG tablet Take 1 tablet (10 mg total) by mouth 3 (three) times daily. 30 each 0   lidocaine  4 % Place 1 patch onto the skin daily. 20 patch 0   meloxicam  (MOBIC ) 7.5 MG tablet TAKE 1 TABLET BY MOUTH EVERY DAY 30 tablet 0   pravastatin  (PRAVACHOL ) 20 MG tablet TAKE 1 TABLET BY MOUTH EVERY DAY 90 tablet 0   traZODone  (DESYREL ) 150 MG tablet TAKE 1 TABLET BY MOUTH AT BEDTIME. 90 tablet 0   aspirin EC 81 MG tablet Take 81 mg by mouth daily. Swallow whole. (Patient not taking: Reported on 06/28/2024)     meloxicam  (MOBIC ) 7.5 MG tablet Take 1 tablet (7.5 mg total) by mouth  daily. (Patient not taking: Reported on 06/28/2024) 30 tablet 0   No current facility-administered medications on file prior to visit.    Allergies  Allergen Reactions   Alprazolam Hives   Alprazolam Hives   Crestor [Rosuvastatin Calcium] Other (See Comments)    Joint pain    Social History   Socioeconomic History   Marital status: Married    Spouse name: Not on file   Number of children: Not on file   Years of education: Not on file   Highest education level: Not on file  Occupational History   Not on file  Tobacco Use   Smoking status: Some Days    Current packs/day: 0.25    Types: Cigarettes    Passive exposure: Current   Smokeless tobacco: Never   Tobacco comments:    No smoking today 07/01/22  Vaping Use   Vaping status: Never Used  Substance and Sexual Activity   Alcohol use: Yes    Alcohol/week: 16.0 standard drinks of alcohol    Types: 16 Standard drinks or equivalent per week    Comment: 1/2 pt. daily   Drug use: Not Currently    Types: Marijuana   Sexual activity: Not on file  Other Topics Concern   Not on file  Social History Narrative   Not on file   Social Drivers of Health   Financial Resource Strain: Low Risk  (06/28/2024)   Overall Financial  Resource Strain (CARDIA)    Difficulty of Paying Living Expenses: Not hard at all  Food Insecurity: Unknown (06/28/2024)   Hunger Vital Sign    Worried About Running Out of Food in the Last Year: Never true    Ran Out of Food in the Last Year: Not on file  Transportation Needs: No Transportation Needs (06/28/2024)   PRAPARE - Administrator, Civil Service (Medical): No    Lack of Transportation (Non-Medical): No  Physical Activity: Inactive (06/28/2024)   Exercise Vital Sign    Days of Exercise per Week: 0 days    Minutes of Exercise per Session: 0 min  Stress: No Stress Concern Present (06/28/2024)   [employer] of Occupational Health - Occupational Stress Questionnaire    Feeling of Stress:  Only a little  Social Connections: Moderately Integrated (06/28/2024)   Social Connection and Isolation Panel    Frequency of Communication with Friends and Family: More than three times a week    Frequency of Social Gatherings with Friends and Family: More than three times a week    Attends Religious Services: More than 4 times per year    Active Member of [employer] or Organizations: No    Attends Banker Meetings: Never    Marital Status: Married  Catering manager Violence: Not At Risk (06/28/2024)   Humiliation, Afraid, Rape, and Kick questionnaire    Fear of Current or Ex-Partner: No    Emotionally Abused: No    Physically Abused: No    Sexually Abused: No    Family History  Problem Relation Age of Onset   Other Mother        lou gehrigs disease   Breast cancer Sister    Aneurysm Sister    Cancer Paternal Grandmother    Colon cancer Neg Hx    Colon polyps Neg Hx    Crohn's disease Neg Hx    Esophageal cancer Neg Hx    Rectal cancer Neg Hx    Stomach cancer Neg Hx     Past Surgical History:  Procedure Laterality Date   COLONOSCOPY  2012   TUBAL LIGATION      ROS: Review of Systems Negative except as stated above  PHYSICAL EXAM: BP 139/85   Pulse 69   Temp 98.3 F (36.8 C) (Oral)   Resp 16   Ht 5' 5 (1.651 m)   Wt 150 lb 9.6 oz (68.3 kg)   LMP  (LMP Unknown)   SpO2 96%   BMI 25.06 kg/m   Physical Exam HENT:     Head: Normocephalic and atraumatic.     Nose: Nose normal.     Mouth/Throat:     Mouth: Mucous membranes are moist.     Pharynx: Oropharynx is clear.  Eyes:     Extraocular Movements: Extraocular movements intact.     Conjunctiva/sclera: Conjunctivae normal.     Pupils: Pupils are equal, round, and reactive to light.  Cardiovascular:     Rate and Rhythm: Normal rate and regular rhythm.     Pulses: Normal pulses.     Heart sounds: Normal heart sounds.  Pulmonary:     Effort: Pulmonary effort is normal.     Breath sounds: Normal  breath sounds.  Musculoskeletal:        General: Normal range of motion.     Cervical back: Normal range of motion and neck supple.  Neurological:     General: No focal deficit present.  Mental Status: She is alert and oriented to person, place, and time.  Psychiatric:        Mood and Affect: Mood normal.        Behavior: Behavior normal.     ASSESSMENT AND PLAN: 1. Hypokalemia (Primary) - Continue Potassium Chloride  as prescribed. Counseled on medication adherence/adverse effects.  - Routine screening.  - Follow-up with primary provider as scheduled. - potassium chloride  SA (KLOR-CON  M20) 20 MEQ tablet; Take 1 tablet (20 mEq total) by mouth 2 (two) times daily.  Dispense: 180 tablet; Refill: 0 - Basic Metabolic Panel  2. Gastroesophageal reflux disease, unspecified whether esophagitis present - Continue Omeprazole  as prescribed. Counseled on medication adherence/adverse effects.  - Follow-up with primary provider as scheduled. - omeprazole  (PRILOSEC) 40 MG capsule; Take 1 capsule (40 mg total) by mouth daily.  Dispense: 90 capsule; Refill: 0   Patient was given the opportunity to ask questions.  Patient verbalized understanding of the plan and was able to repeat key elements of the plan. Patient was given clear instructions to go to Emergency Department or return to medical center if symptoms don't improve, worsen, or new problems develop.The patient verbalized understanding.   Orders Placed This Encounter  Procedures   Basic Metabolic Panel     Requested Prescriptions   Signed Prescriptions Disp Refills   potassium chloride  SA (KLOR-CON  M20) 20 MEQ tablet 180 tablet 0    Sig: Take 1 tablet (20 mEq total) by mouth 2 (two) times daily.   omeprazole  (PRILOSEC) 40 MG capsule 90 capsule 0    Sig: Take 1 capsule (40 mg total) by mouth daily.    Return for Follow-Up or next available with Raguel Blush, MD.  Greig JINNY Drones, NP

## 2024-06-28 NOTE — Progress Notes (Signed)
 Medication refill, light headed this morning she has not had her medication

## 2024-06-28 NOTE — Telephone Encounter (Signed)
 FYI Only or Action Required?: FYI only for provider.  Patient was last seen in primary care on 10/21/2023 by Lorren Greig PARAS, NP. Called Nurse Triage reporting Dizziness. Symptoms began today. Interventions attempted: Rest, hydration, or home remedies. Symptoms are: gradually worsening.  Triage Disposition: See Physician Within 24 Hours  Patient/caregiver understands and will follow disposition?: Yes                             Copied from CRM [id number]. Topic: Clinical - Red Word Triage >> Jun 28, 2024  8:55 AM Tobias CROME wrote: Red Word that prompted transfer to Nurse Triage:  Patient out of potassium. Advised she is needing appointment for further refills. patient states she is feeling lightheaded and  believes its due to being out of potassium. Reason for Disposition  [1] MODERATE dizziness (e.g., interferes with normal activities) AND [2] has NOT been evaluated by doctor (or NP/PA) for this  (Exception: Dizziness caused by heat exposure, sudden standing, or poor fluid intake.)  Answer Assessment - Initial Assessment Questions 1. DESCRIPTION: Describe your dizziness.     States she felt faint/light when she got out of the car this morning  2. LIGHTHEADED: Do you feel lightheaded? (e.g., somewhat faint, woozy, weak upon standing)     Yes, denies passing out/fainting 3. VERTIGO: Do you feel like either you or the room is spinning or tilting? (i.e. vertigo)     Kind of like that 4. SEVERITY: How bad is it?  Do you feel like you are going to faint? Can you stand and walk?   - MILD: Feels slightly dizzy, but walking normally.   - MODERATE: Feels unsteady when walking, but not falling; interferes with normal activities (e.g., school, work).   - SEVERE: Unable to walk without falling, or requires assistance to walk without falling; feels like passing out now.      States she is able to walk around normally at this time 5. ONSET:  When did the dizziness  begin?     Today  6. AGGRAVATING FACTORS: Does anything make it worse? (e.g., standing, change in head position)     Denies 8. CAUSE: What do you think is causing the dizziness?     States she has been out of her potassium since Saturday  9. RECURRENT SYMPTOM: Have you had dizziness before? If Yes, ask: When was the last time? What happened that time?     Denies 10. OTHER SYMPTOMS: Do you have any other symptoms? (e.g., fever, chest pain, vomiting, diarrhea, bleeding)       Denies fever I feel alright, I just feel light  Protocols used: Dizziness - Lightheadedness-A-AH

## 2024-06-29 ENCOUNTER — Ambulatory Visit: Payer: Self-pay | Admitting: Family

## 2024-06-29 LAB — BASIC METABOLIC PANEL WITH GFR
BUN/Creatinine Ratio: 13 (ref 12–28)
BUN: 14 mg/dL (ref 8–27)
CO2: 26 mmol/L (ref 20–29)
Calcium: 9.4 mg/dL (ref 8.7–10.3)
Chloride: 98 mmol/L (ref 96–106)
Creatinine, Ser: 1.06 mg/dL — ABNORMAL HIGH (ref 0.57–1.00)
Glucose: 93 mg/dL (ref 70–99)
Potassium: 3.2 mmol/L — ABNORMAL LOW (ref 3.5–5.2)
Sodium: 141 mmol/L (ref 134–144)
eGFR: 59 mL/min/1.73 — ABNORMAL LOW (ref >59)

## 2024-07-09 ENCOUNTER — Other Ambulatory Visit: Payer: Self-pay | Admitting: Family

## 2024-07-12 ENCOUNTER — Other Ambulatory Visit: Payer: Self-pay | Admitting: Family Medicine

## 2024-07-12 NOTE — Telephone Encounter (Unsigned)
 Copied from CRM [id number]. Topic: Clinical - Medication Refill >> Jul 12, 2024 10:25 AM DeAngela L wrote: Medication: pravastatin  (PRAVACHOL ) 20 MG tablet   Has the patient contacted their pharmacy? Yes  (Agent: If no, request that the patient contact the pharmacy for the refill. If patient does not wish to contact the pharmacy document the reason why and proceed with request.) (Agent: If yes, when and what did the pharmacy advise?)  This is the patient's preferred pharmacy:  [id number] [place], [place] - [address] [place] [address] Phone: [phone] Fax: [phone]  Is this the correct pharmacy for this prescription? Yes  If no, delete pharmacy and type the correct one.   Has the prescription been filled recently? Yes   Is the patient out of the medication? Yes   Has the patient been seen for an appointment in the last year OR does the patient have an upcoming appointment? Yes   Can we respond through MyChart? No   Agent: Please be advised that Rx refills may take up to 3 business days. We ask that you follow-up with your pharmacy.

## 2024-07-12 NOTE — Telephone Encounter (Signed)
 Complete. Please send future refills to patient's primary provider Raguel Blush, MD.

## 2024-07-13 ENCOUNTER — Ambulatory Visit: Admitting: Family Medicine

## 2024-07-14 NOTE — Telephone Encounter (Signed)
 Refused pravastatin  20 mg because this is a duplicate.  Sent on [facility]/12/2024 to [id number]

## 2024-08-04 ENCOUNTER — Ambulatory Visit: Admitting: Family Medicine

## 2024-08-21 ENCOUNTER — Other Ambulatory Visit: Payer: Self-pay | Admitting: Family Medicine

## 2024-08-24 NOTE — Telephone Encounter (Signed)
 Requested Prescriptions  Pending Prescriptions Disp Refills   atenolol -chlorthalidone  (TENORETIC ) 50-25 MG tablet [Pharmacy Med Name: ATENOLOL -CHLORTHALIDONE  50-25] 90 tablet 0    Sig: TAKE 1 TABLET BY MOUTH EVERY DAY     Cardiovascular: Beta Blocker + Diuretic Combos Failed - 08/24/2024  9:24 AM      Failed - K in normal range and within 180 days    Potassium  Date Value Ref Range Status  06/28/2024 3.2 (L) 3.5 - 5.2 mmol/L Final         Failed - Cr in normal range and within 180 days    Creatinine, Ser  Date Value Ref Range Status  06/28/2024 1.06 (H) 0.57 - 1.00 mg/dL Final         Failed - eGFR in normal range and within 180 days    GFR, Estimated  Date Value Ref Range Status  07/31/2023 >60 >60 mL/min Final    Comment:    (NOTE) Calculated using the CKD-EPI Creatinine Equation (2021)    eGFR  Date Value Ref Range Status  06/28/2024 59 (L) >59 mL/min/1.73 Final         Failed - Valid encounter within last 6 months    Recent Outpatient Visits           1 month ago Hypokalemia   [facility], Amy J, NP   10 months ago Abdominal pain, unspecified abdominal location   [facility], Amy J, NP   11 months ago Abdominal pain, unspecified abdominal location   [facility], Amy J, NP   1 year ago Acute cystitis with hematuria   [facility], MD   1 year ago Acute cystitis with hematuria   [facility], Raguel, MD       Future Appointments             In 4 weeks Tanda Raguel, MD [facility] at [facility], [address]            Passed - Na in normal range and within 180 days    Sodium  Date Value Ref Range Status  06/28/2024 141 134 - 144 mmol/L Final         Passed - Last BP in normal range    BP Readings from Last 1 Encounters:  06/28/24  139/85         Passed - Last Heart Rate in normal range    Pulse Readings from Last 1 Encounters:  06/28/24 69

## 2024-09-22 ENCOUNTER — Ambulatory Visit (INDEPENDENT_AMBULATORY_CARE_PROVIDER_SITE_OTHER): Admitting: Family Medicine

## 2024-09-22 ENCOUNTER — Ambulatory Visit: Admitting: Family Medicine

## 2024-09-22 ENCOUNTER — Encounter: Payer: Self-pay | Admitting: Family Medicine

## 2024-09-22 VITALS — BP 116/77 | HR 64 | Ht 65.0 in | Wt 147.8 lb

## 2024-09-22 DIAGNOSIS — E782 Mixed hyperlipidemia: Secondary | ICD-10-CM | POA: Diagnosis not present

## 2024-09-22 DIAGNOSIS — G459 Transient cerebral ischemic attack, unspecified: Secondary | ICD-10-CM

## 2024-09-22 DIAGNOSIS — Z1231 Encounter for screening mammogram for malignant neoplasm of breast: Secondary | ICD-10-CM

## 2024-09-22 DIAGNOSIS — F17200 Nicotine dependence, unspecified, uncomplicated: Secondary | ICD-10-CM

## 2024-09-22 MED ORDER — ATORVASTATIN CALCIUM 40 MG PO TABS: 40.0000 mg | ORAL_TABLET | Freq: Every day | ORAL | 0 refills | Status: DC

## 2024-09-22 NOTE — Progress Notes (Signed)
 Established Patient Office Visit  Subjective    Patient ID: Teresa Cabrera, female    DOB: [birth year]  Age: 63 y.o. MRN: [id number]  CC:  Chief Complaint  Patient presents with   Medical Management of Chronic Issues    Pt reports numbness on left side of face  Needs referral for mammogram  Wants pneumonia and flu vaccines     HPI Teresa Cabrera presents with complaint of right sided facial numbness. She reports sx occur in the morning. She takes a baby aspirin. Sx resolve in about 1 hour. This has been happening for about 1-2 weeks.   Outpatient Encounter Medications as of 09/22/2024  Medication Sig   atenolol -chlorthalidone  (TENORETIC ) 50-25 MG tablet TAKE 1 TABLET BY MOUTH EVERY DAY   atorvastatin (LIPITOR) 40 MG tablet Take 1 tablet (40 mg total) by mouth daily.   lidocaine  4 % Place 1 patch onto the skin daily.   omeprazole  (PRILOSEC) 40 MG capsule Take 1 capsule (40 mg total) by mouth daily.   potassium chloride  SA (KLOR-CON  M20) 20 MEQ tablet Take 1 tablet (20 mEq total) by mouth 2 (two) times daily.   pravastatin  (PRAVACHOL ) 20 MG tablet TAKE 1 TABLET BY MOUTH EVERY DAY   traZODone  (DESYREL ) 150 MG tablet TAKE 1 TABLET BY MOUTH AT BEDTIME.   aspirin EC 81 MG tablet Take 81 mg by mouth daily. Swallow whole. (Patient not taking: Reported on 06/28/2024)   baclofen  (LIORESAL ) 10 MG tablet Take 1 tablet (10 mg total) by mouth 3 (three) times daily. (Patient not taking: Reported on 09/22/2024)   meloxicam  (MOBIC ) 7.5 MG tablet TAKE 1 TABLET BY MOUTH EVERY DAY (Patient not taking: Reported on 09/22/2024)   meloxicam  (MOBIC ) 7.5 MG tablet Take 1 tablet (7.5 mg total) by mouth daily. (Patient not taking: Reported on 09/22/2024)   No facility-administered encounter medications on file as of 09/22/2024.    Past Medical History:  Diagnosis Date   Anemia    Anxiety    Depression    GERD (gastroesophageal reflux disease)    Hyperlipidemia    Hypertension    Insomnia    Substance abuse  (HCC)     Past Surgical History:  Procedure Laterality Date   COLONOSCOPY  2012   TUBAL LIGATION      Family History  Problem Relation Age of Onset   Other Mother        lou gehrigs disease   Breast cancer Sister    Aneurysm Sister    Cancer Paternal Grandmother    Colon cancer Neg Hx    Colon polyps Neg Hx    Crohn's disease Neg Hx    Esophageal cancer Neg Hx    Rectal cancer Neg Hx    Stomach cancer Neg Hx     Social History   Socioeconomic History   Marital status: Married    Spouse name: Not on file   Number of children: Not on file   Years of education: Not on file   Highest education level: Not on file  Occupational History   Not on file  Tobacco Use   Smoking status: Some Days    Current packs/day: 0.25    Types: Cigarettes    Passive exposure: Current   Smokeless tobacco: Never   Tobacco comments:    No smoking today 07/01/22  Vaping Use   Vaping status: Never Used  Substance and Sexual Activity   Alcohol use: Yes    Alcohol/week: 16.0 standard drinks of alcohol  Types: 16 Standard drinks or equivalent per week    Comment: 1/2 pt. daily   Drug use: Not Currently    Types: Marijuana   Sexual activity: Not on file  Other Topics Concern   Not on file  Social History Narrative   Not on file   Social Drivers of Health   Financial Resource Strain: Low Risk  (06/28/2024)   Overall Financial Resource Strain (CARDIA)    Difficulty of Paying Living Expenses: Not hard at all  Food Insecurity: Unknown (06/28/2024)   Hunger Vital Sign    Worried About Running Out of Food in the Last Year: Never true    Ran Out of Food in the Last Year: Not on file  Transportation Needs: No Transportation Needs (06/28/2024)   PRAPARE - Administrator, Civil Service (Medical): No    Lack of Transportation (Non-Medical): No  Physical Activity: Inactive (06/28/2024)   Exercise Vital Sign    Days of Exercise per Week: 0 days    Minutes of Exercise per Session: 0 min   Stress: No Stress Concern Present (06/28/2024)   [employer] of Occupational Health - Occupational Stress Questionnaire    Feeling of Stress: Only a little  Social Connections: Moderately Integrated (06/28/2024)   Social Connection and Isolation Panel    Frequency of Communication with Friends and Family: More than three times a week    Frequency of Social Gatherings with Friends and Family: More than three times a week    Attends Religious Services: More than 4 times per year    Active Member of [employer] or Organizations: No    Attends Banker Meetings: Never    Marital Status: Married  Catering manager Violence: Not At Risk (06/28/2024)   Humiliation, Afraid, Rape, and Kick questionnaire    Fear of Current or Ex-Partner: No    Emotionally Abused: No    Physically Abused: No    Sexually Abused: No    Review of Systems  All other systems reviewed and are negative.       Objective    BP 116/77   Pulse 64   Ht 5' 5 (1.651 m)   Wt 147 lb 12.8 oz (67 kg)   SpO2 92%   BMI 24.60 kg/m   Physical Exam Vitals and nursing note reviewed.  Constitutional:      General: She is not in acute distress. Cardiovascular:     Rate and Rhythm: Normal rate and regular rhythm.  Pulmonary:     Effort: Pulmonary effort is normal.     Breath sounds: Normal breath sounds.  Abdominal:     Palpations: Abdomen is soft.     Tenderness: There is no abdominal tenderness.  Neurological:     General: No focal deficit present.     Mental Status: She is alert and oriented to person, place, and time.     Motor: No weakness.     Gait: Gait normal.  Psychiatric:        Mood and Affect: Mood normal.        Behavior: Behavior normal.         Assessment & Plan:  1. TIA (transient ischemic attack) (Primary) Clinically possible based on sxs. Patient to increase aspirin to 325 mg daily. Discussed with patient that if sx recur or persist to go to ED/911 immediately for further eval/mgt.  Patient v.u  2. Mixed hyperlipidemia Will d/c pravastatin  and start lipitor 40 mg  3. Current smoker Discussed  reduction/cessation  4. Encounter for screening mammogram for malignant neoplasm of breast  - MM 3D SCREENING MAMMOGRAM BILATERAL BREAST; Future    Return in about 4 weeks (around 10/20/2024) for follow up.   Tanda Raguel SQUIBB, MD

## 2024-09-23 ENCOUNTER — Other Ambulatory Visit: Payer: Self-pay | Admitting: Family

## 2024-09-23 DIAGNOSIS — E876 Hypokalemia: Secondary | ICD-10-CM

## 2024-09-23 DIAGNOSIS — K219 Gastro-esophageal reflux disease without esophagitis: Secondary | ICD-10-CM

## 2024-10-01 ENCOUNTER — Other Ambulatory Visit: Payer: Self-pay

## 2024-10-01 DIAGNOSIS — K760 Fatty (change of) liver, not elsewhere classified: Secondary | ICD-10-CM

## 2024-10-07 NOTE — Telephone Encounter (Signed)
 Unable to leave message, no VM. Letter mailed home.

## 2024-10-23 ENCOUNTER — Observation Stay (HOSPITAL_BASED_OUTPATIENT_CLINIC_OR_DEPARTMENT_OTHER)
Admission: EM | Admit: 2024-10-23 | Discharge: 2024-10-25 | Disposition: A | Discharge status: Home / Self Care | Attending: Internal Medicine | Admitting: Internal Medicine

## 2024-10-23 ENCOUNTER — Emergency Department (HOSPITAL_BASED_OUTPATIENT_CLINIC_OR_DEPARTMENT_OTHER)

## 2024-10-23 ENCOUNTER — Encounter (HOSPITAL_BASED_OUTPATIENT_CLINIC_OR_DEPARTMENT_OTHER): Payer: Self-pay | Admitting: Emergency Medicine

## 2024-10-23 DIAGNOSIS — E876 Hypokalemia: Secondary | ICD-10-CM | POA: Insufficient documentation

## 2024-10-23 DIAGNOSIS — F17210 Nicotine dependence, cigarettes, uncomplicated: Secondary | ICD-10-CM | POA: Diagnosis not present

## 2024-10-23 DIAGNOSIS — Z743 Need for continuous supervision: Secondary | ICD-10-CM | POA: Diagnosis not present

## 2024-10-23 DIAGNOSIS — F1090 Alcohol use, unspecified, uncomplicated: Secondary | ICD-10-CM | POA: Diagnosis not present

## 2024-10-23 DIAGNOSIS — Z79899 Other long term (current) drug therapy: Secondary | ICD-10-CM | POA: Insufficient documentation

## 2024-10-23 DIAGNOSIS — I10 Essential (primary) hypertension: Secondary | ICD-10-CM | POA: Diagnosis not present

## 2024-10-23 DIAGNOSIS — R202 Paresthesia of skin: Secondary | ICD-10-CM | POA: Diagnosis present

## 2024-10-23 DIAGNOSIS — G459 Transient cerebral ischemic attack, unspecified: Secondary | ICD-10-CM | POA: Diagnosis present

## 2024-10-23 DIAGNOSIS — E785 Hyperlipidemia, unspecified: Secondary | ICD-10-CM | POA: Diagnosis not present

## 2024-10-23 DIAGNOSIS — Z7901 Long term (current) use of anticoagulants: Secondary | ICD-10-CM | POA: Diagnosis not present

## 2024-10-23 DIAGNOSIS — Z7982 Long term (current) use of aspirin: Secondary | ICD-10-CM | POA: Insufficient documentation

## 2024-10-23 DIAGNOSIS — D750 Familial erythrocytosis: Secondary | ICD-10-CM | POA: Insufficient documentation

## 2024-10-23 LAB — DIFFERENTIAL
Abs Immature Granulocytes: 0.01 K/uL (ref 0.00–0.07)
Basophils Absolute: 0.1 K/uL (ref 0.0–0.1)
Basophils Relative: 1 %
Eosinophils Absolute: 0.1 K/uL (ref 0.0–0.5)
Eosinophils Relative: 1 %
Immature Granulocytes: 0 %
Lymphocytes Relative: 41 %
Lymphs Abs: 2.4 K/uL (ref 0.7–4.0)
Monocytes Absolute: 0.3 K/uL (ref 0.1–1.0)
Monocytes Relative: 5 %
Neutro Abs: 3 K/uL (ref 1.7–7.7)
Neutrophils Relative %: 52 %

## 2024-10-23 LAB — CBC
HCT: 45.2 % (ref 36.0–46.0)
Hemoglobin: 16 g/dL — ABNORMAL HIGH (ref 12.0–15.0)
MCH: 32.2 pg (ref 26.0–34.0)
MCHC: 35.4 g/dL (ref 30.0–36.0)
MCV: 90.9 fL (ref 80.0–100.0)
Platelets: 409 K/uL — ABNORMAL HIGH (ref 150–400)
RBC: 4.97 MIL/uL (ref 3.87–5.11)
RDW: 13.4 % (ref 11.5–15.5)
WBC: 5.8 K/uL (ref 4.0–10.5)
nRBC: 0 % (ref 0.0–0.2)

## 2024-10-23 LAB — COMPREHENSIVE METABOLIC PANEL WITH GFR
ALT: 12 U/L (ref 0–44)
AST: 23 U/L (ref 15–41)
Albumin: 3.6 g/dL (ref 3.5–5.0)
Alkaline Phosphatase: 41 U/L (ref 38–126)
Anion gap: 12 (ref 5–15)
BUN: 11 mg/dL (ref 8–23)
CO2: 25 mmol/L (ref 22–32)
Calcium: 8.7 mg/dL — ABNORMAL LOW (ref 8.9–10.3)
Chloride: 104 mmol/L (ref 98–111)
Creatinine, Ser: 1.08 mg/dL — ABNORMAL HIGH (ref 0.44–1.00)
GFR, Estimated: 57 mL/min — ABNORMAL LOW (ref >60)
Glucose, Bld: 97 mg/dL (ref 70–99)
Potassium: 3.4 mmol/L — ABNORMAL LOW (ref 3.5–5.1)
Sodium: 140 mmol/L (ref 135–145)
Total Bilirubin: <0.2 mg/dL (ref 0.0–1.2)
Total Protein: 5.4 g/dL — ABNORMAL LOW (ref 6.5–8.1)

## 2024-10-23 LAB — ETHANOL: Alcohol, Ethyl (B): <15 mg/dL (ref <15)

## 2024-10-23 LAB — CBG MONITORING, ED: Glucose-Capillary: 87 mg/dL (ref 70–99)

## 2024-10-23 MED ORDER — ASPIRIN 81 MG PO TBEC
81.0000 mg | DELAYED_RELEASE_TABLET | Freq: Every day | ORAL | Status: DC
  Administered 2024-10-24 – 2024-10-25 (×2): 81 mg via ORAL
  Filled 2024-10-23 (×2): qty 1

## 2024-10-23 MED ORDER — MELATONIN 5 MG PO TABS
5.0000 mg | ORAL_TABLET | Freq: Every evening | ORAL | Status: DC | PRN
  Administered 2024-10-23 – 2024-10-24 (×2): 5 mg via ORAL
  Filled 2024-10-23 (×2): qty 1

## 2024-10-23 MED ORDER — SODIUM CHLORIDE 0.9% FLUSH: 3.0000 mL | Freq: Once | INTRAVENOUS | Status: DC

## 2024-10-23 MED ORDER — POLYETHYLENE GLYCOL 3350 17 G PO PACK: 17.0000 g | PACK | Freq: Every day | ORAL | Status: DC | PRN

## 2024-10-23 MED ORDER — PROCHLORPERAZINE EDISYLATE 10 MG/2ML IJ SOLN
5.0000 mg | Freq: Four times a day (QID) | INTRAMUSCULAR | Status: DC | PRN
Start: 2024-10-23 — End: 2024-10-25

## 2024-10-23 MED ORDER — IOHEXOL 350 MG/ML SOLN
75.0000 mL | Freq: Once | INTRAVENOUS | Status: AC | PRN
  Administered 2024-10-23: 75 mL via INTRAVENOUS

## 2024-10-23 MED ORDER — ATORVASTATIN CALCIUM 40 MG PO TABS
40.0000 mg | ORAL_TABLET | Freq: Every day | ORAL | Status: DC
  Administered 2024-10-24 – 2024-10-25 (×2): 40 mg via ORAL
  Filled 2024-10-23 (×2): qty 1

## 2024-10-23 MED ORDER — PANTOPRAZOLE SODIUM 40 MG PO TBEC
40.0000 mg | DELAYED_RELEASE_TABLET | Freq: Every day | ORAL | Status: DC
  Administered 2024-10-24 – 2024-10-25 (×2): 40 mg via ORAL
  Filled 2024-10-23 (×2): qty 1

## 2024-10-23 MED ORDER — STROKE: EARLY STAGES OF RECOVERY BOOK
Freq: Once | Status: AC
  Filled 2024-10-23: qty 1

## 2024-10-23 MED ORDER — LACTATED RINGERS IV SOLN: INTRAVENOUS | Status: DC

## 2024-10-23 MED ORDER — ACETAMINOPHEN 500 MG PO TABS: 500.0000 mg | ORAL_TABLET | Freq: Four times a day (QID) | ORAL | Status: DC | PRN

## 2024-10-23 MED ORDER — POTASSIUM CHLORIDE 20 MEQ PO PACK
20.0000 meq | PACK | Freq: Two times a day (BID) | ORAL | Status: DC
Start: 2024-10-23 — End: 2024-10-24
  Administered 2024-10-23: 20 meq via ORAL
  Filled 2024-10-23: qty 1

## 2024-10-23 MED ORDER — ENOXAPARIN SODIUM 40 MG/0.4ML IJ SOSY
40.0000 mg | PREFILLED_SYRINGE | INTRAMUSCULAR | Status: DC
  Administered 2024-10-24: 40 mg via SUBCUTANEOUS
  Filled 2024-10-23: qty 0.4

## 2024-10-23 NOTE — ED Triage Notes (Signed)
 Numbness this AM Around noon / 1 pm symptoms started Took asa at home

## 2024-10-23 NOTE — ED Notes (Signed)
 Patient transported to CT

## 2024-10-23 NOTE — ED Provider Notes (Signed)
 [place] EMERGENCY DEPARTMENT AT [facility] Provider Note   CSN: [id number] Arrival date & time: 10/23/24  1456  An emergency department physician performed an initial assessment on this suspected stroke patient at 1502.  Patient presents with: Numbness   Teresa Cabrera is a 63 y.o. female.   HPI   63 year old female with medical history significant for anxiety, depression, GERD, HTN, HLD, substance abuse, anemia presenting to the emergency department with a chief complaint of strokelike symptoms.  Around 1 PM symptoms came on with numbness in the left hemibody.  The patient endorses numbness in the left side of her face as well as left arm and left leg.  She also feels like she is feeling weakness in the left arm and leg.  No facial droop.  No difficulty with speech or swallowing.  On arrival, CBG was 76 and due to symptoms within the TNK window code stroke was called.  Prior to Admission medications   Medication Sig Start Date End Date Taking? Authorizing Provider  aspirin EC 81 MG tablet Take 81 mg by mouth daily. Swallow whole. Patient not taking: Reported on 06/28/2024    [provider]  atenolol -chlorthalidone  (TENORETIC ) 50-25 MG tablet TAKE 1 TABLET BY MOUTH EVERY DAY 08/24/24   Tanda Bleacher, MD  atorvastatin (LIPITOR) 40 MG tablet Take 1 tablet (40 mg total) by mouth daily. 09/22/24   Tanda Bleacher, MD  baclofen  (LIORESAL ) 10 MG tablet Take 1 tablet (10 mg total) by mouth 3 (three) times daily. Patient not taking: Reported on 09/22/2024 05/29/24   Johnie Flaming A, NP  lidocaine  4 % Place 1 patch onto the skin daily. 05/29/24   Johnie Flaming A, NP  meloxicam  (MOBIC ) 7.5 MG tablet TAKE 1 TABLET BY MOUTH EVERY DAY Patient not taking: Reported on 09/22/2024 11/24/23   Lorren Greig PARAS, NP  meloxicam  (MOBIC ) 7.5 MG tablet Take 1 tablet (7.5 mg total) by mouth daily. Patient not taking: Reported on 09/22/2024 11/24/23   Lorren Greig PARAS, NP  omeprazole  (PRILOSEC) 40  MG capsule TAKE 1 CAPSULE (40 MG TOTAL) BY MOUTH DAILY. 09/23/24   Tanda Bleacher, MD  potassium chloride  SA (KLOR-CON  M) 20 MEQ tablet TAKE 1 TABLET BY MOUTH TWICE A DAY 09/23/24   Tanda Bleacher, MD  pravastatin  (PRAVACHOL ) 20 MG tablet TAKE 1 TABLET BY MOUTH EVERY DAY 07/12/24   Lorren Greig PARAS, NP  traZODone  (DESYREL ) 150 MG tablet TAKE 1 TABLET BY MOUTH AT BEDTIME. 08/01/23   Lorren Greig PARAS, NP    Allergies: Alprazolam, Alprazolam, and Crestor [rosuvastatin calcium]    Review of Systems  Unable to perform ROS: Acuity of condition    Updated Vital Signs BP (!) 179/98   Pulse 60   Temp 98.1 F (36.7 C) (Oral)   Resp 17   SpO2 99%   Physical Exam Vitals and nursing note reviewed.  Constitutional:      General: She is not in acute distress. HENT:     Head: Normocephalic and atraumatic.  Eyes:     Conjunctiva/sclera: Conjunctivae normal.     Pupils: Pupils are equal, round, and reactive to light.  Cardiovascular:     Rate and Rhythm: Normal rate and regular rhythm.  Pulmonary:     Effort: Pulmonary effort is normal. No respiratory distress.  Abdominal:     General: There is no distension.     Tenderness: There is no guarding.  Musculoskeletal:        General: No deformity or signs of injury.  Cervical back: Neck supple.  Skin:    Findings: No lesion or rash.  Neurological:     Mental Status: She is alert.     Comments: MENTAL STATUS EXAM:    Orientation: Alert and oriented to person, place and time.  Memory: Cooperative, follows commands well.  Language: Speech is clear and language is normal.   CRANIAL NERVES:    CN 2 (Optic): Visual fields intact to confrontation.  CN 3,4,6 (EOM): Pupils equal and reactive to light. Full extraocular eye movement without nystagmus.  CN 5 (Trigeminal): Facial sensation is DIMINISHED ON THE LEFT, no weakness of masticatory muscles.  CN 7 (Facial): No facial weakness or asymmetry.  CN 8 (Auditory): Auditory acuity grossly normal.   CN 9,10 (Glossophar): The uvula is midline, the palate elevates symmetrically.  CN 11 (spinal access): Normal sternocleidomastoid and trapezius strength.  CN 12 (Hypoglossal): The tongue is midline. No atrophy or fasciculations.SABRA   MOTOR:  Muscle Strength: 5/5RUE, 4/5LUE, 5/5RLE, 4/5LLE  REFLEXES: No clonus.   COORDINATION:   No tremor.   SENSATION:   Intact to light touch all four extremities, DIMINISHED IN THE LEFT HEMIBODY.  GAIT: Gait not assessed      (all labs ordered are listed, but only abnormal results are displayed) Labs Reviewed  CBC - Abnormal; Notable for the following components:      Result Value   Hemoglobin 16.0 (*)    Platelets 409 (*)    All other components within normal limits  COMPREHENSIVE METABOLIC PANEL WITH GFR - Abnormal; Notable for the following components:   Potassium 3.4 (*)    Creatinine, Ser 1.08 (*)    Calcium 8.7 (*)    Total Protein 5.4 (*)    GFR, Estimated 57 (*)    All other components within normal limits  DIFFERENTIAL  PROTIME-INR  APTT  ETHANOL  CBG MONITORING, ED  CBG MONITORING, ED    EKG: EKG Interpretation Date/Time:  Saturday October 23 2024 15:04:15 EDT Ventricular Rate:  63 PR Interval:  147 QRS Duration:  90 QT Interval:  462 QTC Calculation: 473 R Axis:   57  Text Interpretation: Sinus rhythm Abnormal R-wave progression, early transition Confirmed by Jerrol Agent ([id number]) on 10/23/2024 3:12:18 PM  Radiology: CT ANGIO HEAD NECK W WO CM (CODE STROKE) Result Date: 10/23/2024 EXAM: CTA Head and Neck with Intravenous Contrast. CT Head without Contrast. CLINICAL HISTORY: Neuro deficit, acute, stroke suspected. TECHNIQUE: Axial CTA images of the head and neck performed with intravenous contrast. MIP reconstructed images were created and reviewed. Axial computed tomography images of the head/brain performed without intravenous contrast. Note: Per PQRS, the description of internal carotid artery percent stenosis,  including 0 percent or normal exam, is based on [place] American Symptomatic Carotid Endarterectomy Trial (NASCET) criteria. Dose reduction technique was used including one or more of the following: automated exposure control, adjustment of mA and kV according to patient size, and/or iterative reconstruction. CONTRAST: 75mL iohexol  (OMNIPAQUE ) 350 MG/ML injection 75 mL IOHEXOL  350 MG/ML SOLN. COMPARISON: None provided. FINDINGS: BRAIN: No acute intraparenchymal hemorrhage. No mass lesion. No CT evidence for acute territorial infarct. No midline shift or extra-axial collection. VENTRICLES: No hydrocephalus. ORBITS: The orbits are unremarkable. SINUSES AND MASTOIDS: The paranasal sinuses and mastoid air cells are clear. COMMON CAROTID ARTERIES: No significant stenosis. No dissection or occlusion. INTERNAL CAROTID ARTERIES: No stenosis by NASCET criteria. No dissection or occlusion. VERTEBRAL ARTERIES: No significant stenosis. No dissection or occlusion. ANTERIOR CEREBRAL ARTERIES: No significant stenosis. No occlusion.  No aneurysm. MIDDLE CEREBRAL ARTERIES: No significant stenosis. No occlusion. No aneurysm. POSTERIOR CEREBRAL ARTERIES: No significant stenosis. No occlusion. No aneurysm. BASILAR ARTERY: No significant stenosis. No occlusion. No aneurysm. SOFT TISSUES: No acute finding. No masses or lymphadenopathy. BONES: No acute osseous abnormality. IMPRESSION: 1. No acute intracranial hemorrhage or ischemic change. 2. No evidence of significant stenosis, aneurysmal dilatation, or dissection involving the arteries of the head and neck. Electronically signed by: Franky Stanford MD 10/23/2024 03:49 PM EDT RP Workstation: [id number]   CT HEAD CODE STROKE WO CONTRAST Result Date: 10/23/2024 EXAM: CT HEAD WITHOUT CONTRAST 10/23/2024 03:11:29 PM TECHNIQUE: CT of the head was performed without the administration of intravenous contrast. Automated exposure control, iterative reconstruction, and/or weight based adjustment of  the mA/kV was utilized to reduce the radiation dose to as low as reasonably achievable. COMPARISON: Comparison 08/15/2024. CLINICAL HISTORY: Neuro deficit, acute, stroke suspected. FINDINGS: BRAIN AND VENTRICLES: No acute hemorrhage. No evidence of acute infarct. No hydrocephalus. No extra-axial collection. No mass effect or midline shift. [place] Stroke Program Early CT Score (ASPECTS) Ganglionic (caudate, ic, lentiform nucleus, insula, M1-m3): 7 Supraganglionic (m4-m6): 3 Total: 10 ORBITS: No acute abnormality. SINUSES: No acute abnormality. SOFT TISSUES AND SKULL: No acute soft tissue abnormality. No skull fracture. IMPRESSION: 1. No acute intracranial abnormality. 2. ASPECTS: 10. Findings discussed with Dr. Lynwood Moulder at 3:17 pm on 11/03/24 Electronically signed by: Franky Stanford MD 10/23/2024 03:18 PM EDT RP Workstation: [id number]     Procedures   Medications Ordered in the ED  sodium chloride  flush (NS) 0.9 % injection 3 mL (has no administration in time range)  iohexol  (OMNIPAQUE ) 350 MG/ML injection 75 mL (75 mLs Intravenous Contrast Given 10/23/24 1541)                                    Medical Decision Making Amount and/or Complexity of Data Reviewed Labs: ordered. Radiology: ordered.  Risk Decision regarding hospitalization.    63 year old female with medical history significant for anxiety, depression, GERD, HTN, HLD, substance abuse, anemia presenting to the emergency department with a chief complaint of strokelike symptoms.  Around 1 PM symptoms came on with numbness in the left hemibody.  The patient endorses numbness in the left side of her face as well as left arm and left leg.  She also feels like she is feeling weakness in the left arm and leg.  No facial droop.  No difficulty with speech or swallowing.  On arrival, CBG was 34 and due to symptoms within the TNK window code stroke was called.  On arrival, the patient was afebrile, not tachycardic or tachypneic,  hypertensive BP 179/98, saturating 97% on room air.  Code stroke had been called on my initial evaluation based on left hemibody weakness as well as numbness.  Teleneurology was engaged.  The patient was taken to the CT scanner for code stroke imaging.  CT Head: IMPRESSION:  1. No acute intracranial abnormality.  2. ASPECTS: 10.   CTA Head and Neck: IMPRESSION:  1. No acute intracranial hemorrhage or ischemic change.  2. No evidence of significant stenosis, aneurysmal dilatation, or dissection  involving the arteries of the head and neck.    Teleneuro consult: By the time of Dr. Germaine of teleneurology's evaluation, the patient's symptoms were improving.  She recommended MRI of the brain and admission for TIA workup.  Patient not a TNK candidate.  MRI brain ordered.  Hospitalist medicine consulted for admission.     Final diagnoses:  TIA (transient ischemic attack)    ED Discharge Orders     None          Jerrol Agent, MD 10/23/24 1713

## 2024-10-23 NOTE — Consult Note (Signed)
 [facility] Telemedicine Consult   Requesting Provider: Jerrol Consult Participants: nurse Location of the provider: [facility] Location of the patient: [place]  This consult was provided via telemedicine with 2-way video and audio communication. The patient/family was informed that care would be provided in this way and agreed to receive care in this manner.    Chief Complaint: Left sided numbness  HPI: 63 year old female with a history of HTN, HLD, anxiety who reports awakening today at baseline.  After eating breakfast noted lightheadedness and tingling on the left side of her body.  Symptoms did not resolve spontaneously therefore patient presented for evaluation.  Reports symptoms starting to resolve at this point.  Had similar episode in 2024 with negative work up.      LKW: 1000 on 10/23/2024 tpa given?: No, Outside time window, nondisabling symptoms IR Thrombectomy? No, no target lesion identified Modified Rankin Scale: 0-Completely asymptomatic and back to baseline post- stroke Time of teleneurologist evaluation: 1505  Exam: Vitals:   10/23/24 1500 10/23/24 1501  BP: (!) 179/98   Pulse: 65   Resp: 17 17  Temp: 98.1 F (36.7 C)   SpO2: 97%     General: NAD  1A: Level of Consciousness - 0 1B: Ask Month and Age - 0 1C: 'Blink Eyes' & 'Squeeze Hands' - 0 2: Test Horizontal Extraocular Movements - 0 3: Test Visual Fields - 0 4: Test Facial Palsy - 0 5A: Test Left Arm Motor Drift - 0 5B: Test Right Arm Motor Drift - 0 6A: Test Left Leg Motor Drift - 0 6B: Test Right Leg Motor Drift - 0 7: Test Limb Ataxia - 0 8: Test Sensation - 0 9: Test Language/Aphasia- 0 10: Test Dysarthria - 0 11: Test Extinction/Inattention - 0 NIHSS score: 0   Imaging Reviewed:  CT HEAD WITHOUT CONTRAST 10/23/2024 03:11:29 PM   TECHNIQUE: CT of the head was performed without the administration of intravenous contrast. Automated exposure control, iterative reconstruction,  and/or weight based adjustment of the mA/kV was utilized to reduce the radiation dose to as low as reasonably achievable.   COMPARISON: Comparison 08/15/2024.   CLINICAL HISTORY: Neuro deficit, acute, stroke suspected.   FINDINGS:   BRAIN AND VENTRICLES: No acute hemorrhage. No evidence of acute infarct. No hydrocephalus. No extra-axial collection. No mass effect or midline shift.   [place] Stroke Program Early CT Score (ASPECTS)   Ganglionic (caudate, ic, lentiform nucleus, insula, M1-m3): 7   Supraganglionic (m4-m6): 3   Total: 10   ORBITS: No acute abnormality.   SINUSES: No acute abnormality.   SOFT TISSUES AND SKULL: No acute soft tissue abnormality. No skull fracture.   IMPRESSION: 1. No acute intracranial abnormality. 2. ASPECTS: 10. Findings discussed with Dr. Lynwood Jerrol at 3:17 pm on 11/03/24  CTA Head and Neck with Intravenous Contrast. CT Head without Contrast.   CLINICAL HISTORY: Neuro deficit, acute, stroke suspected.   TECHNIQUE: Axial CTA images of the head and neck performed with intravenous contrast. MIP reconstructed images were created and reviewed. Axial computed tomography images of the head/brain performed without intravenous contrast.   Note: Per PQRS, the description of internal carotid artery percent stenosis, including 0 percent or normal exam, is based on [place] American Symptomatic Carotid Endarterectomy Trial (NASCET) criteria. Dose reduction technique was used including one or more of the following: automated exposure control, adjustment of mA and kV according to patient size, and/or iterative reconstruction.   CONTRAST: 75mL iohexol  (OMNIPAQUE ) 350 MG/ML injection 75 mL IOHEXOL  350  MG/ML SOLN.   COMPARISON: None provided.   FINDINGS:   BRAIN: No acute intraparenchymal hemorrhage. No mass lesion. No CT evidence for acute territorial infarct. No midline shift or extra-axial collection.   VENTRICLES: No  hydrocephalus.   ORBITS: The orbits are unremarkable.   SINUSES AND MASTOIDS: The paranasal sinuses and mastoid air cells are clear.   COMMON CAROTID ARTERIES: No significant stenosis. No dissection or occlusion.   INTERNAL CAROTID ARTERIES: No stenosis by NASCET criteria. No dissection or occlusion.   VERTEBRAL ARTERIES: No significant stenosis. No dissection or occlusion.   ANTERIOR CEREBRAL ARTERIES: No significant stenosis. No occlusion. No aneurysm.   MIDDLE CEREBRAL ARTERIES: No significant stenosis. No occlusion. No aneurysm.   POSTERIOR CEREBRAL ARTERIES: No significant stenosis. No occlusion. No aneurysm.   BASILAR ARTERY: No significant stenosis. No occlusion. No aneurysm.   SOFT TISSUES: No acute finding. No masses or lymphadenopathy.   BONES: No acute osseous abnormality.   IMPRESSION: 1. No acute intracranial hemorrhage or ischemic change. 2. No evidence of significant stenosis, aneurysmal dilatation, or dissection involving the arteries of the head and neck.    Labs reviewed in epic and pertinent values follow: CBG 87   Assessment: 63 year old female with a history of HTN, HLD, anxiety who reports awakening today at baseline.  After eating breakfast noted lightheadedness and tingling on the left side of her body.  Symptoms did not resolve spontaneously therefore patient presented for evaluation.  Reports symptoms starting to resolve at this point.  Had similar episode in 2024 with negative work up.   Head CT personally reviewed and shows no acute changes.  CTA of the head and neck shows no evidence of LVO.  Patient outside time window for thrombolytics and without target lesion therefore thrombectomy not indicated.  Patient on ASA and high intensity statin.  Can not rule out TIA.  Further work up indicated.    Recommendations:  1. HgbA1c, fasting lipid panel.  Goal A1c<7.0, goal LDL<70 2. MRI of the brain without contrast 3. PT consult, OT consult,  Speech consult 4. Echocardiogram 5. Prophylactic therapy-Dual antiplatelet therapy with ASA 81mg  and Plavix 75mg  for three weeks with change to Plavix 75mg  daily alone as monotherapy after that time.  6. NPO until RN stroke swallow screen 7. Telemetry monitoring 8. Frequent neuro checks  9. Permissive BP management with no treatment of SBP<200 for the first 24-48 hours.  Goal BP after that time <140/80     This patient is receiving care for possible acute neurological changes. Care by this provider at the time of service included time for direct evaluation via telemedicine, review of medical records, imaging studies and discussion of findings with providers, the patient and/or family.  Sonny Hock, MD Neurology   If 8pm- 8am, please page neurology on call as listed in AMION.

## 2024-10-23 NOTE — H&P (Signed)
 History and Physical  Teriah Muela FMW:[id number] DOB: 1961-12-17 DOA: 10/23/2024  Referring physician: Accepted by Dr. Tobie, [facility], hospitalist service. PCP: Tanda Bleacher, MD  Outpatient Specialists: None. Patient coming from: [facility] through [facility].  Chief Complaint: Left-sided numbness.  HPI: Teresa Cabrera is a 63 y.o. female with medical history significant for hyperlipidemia, hypertension, GERD, current tobacco user (3 to 4 cigarettes when she drinks alcohol), alcohol use disorder(now less than half a pint a day from previously 1 pint of alcohol per day- She is working on cutting back.), who initially presented to [facility] ED due to left-sided numbness.  Last known well was around 8 PM on 10/22/2024.  Endorses for the past 1 month has had numbness in her left lower extremity after waking up in the morning.  She would take 1 baby aspirin and then the numbness will go away.  Last night she went to bed around 8 PM and when she woke up at 8 AM she noticed that her left side was numb.  Endorses numbness in the left side of her face, left arm, and left leg.  Also felt like she was feeling weak in her left arm and leg.  No facial droop.  No difficulty with speech or swallowing.  Her maternal cousin was diagnosed with stroke recently.  She was worried, therefore she presented to the ER for further evaluation.  This is her second presentation this month for left-sided numbness.    Code stroke was called in the ER after initial evaluation by EDP.  The patient was seen by teleneurology.  Noncontrast head CT was nonacute.  CT angio head and neck showed no LVO.    In the ER, symptoms were improving.  Neurology recommended MRI brain and admission for TIA workup.  The patient was not a TNK candidate.  Later, MRI brain resulted and it was nonacute.  EDP requested admission for TIA workup.  Admitted by [facility], hospitalist service.  Accepted by Dr. Yetta Tobie and transferred to [facility] telemetry  unit as observation status.  ED Course: Temperature 97.7.  BP 137/83, pulse 57, respiration rate 17, O2 saturation 98% on room air.  Review of Systems: Review of systems as noted in the HPI. All other systems reviewed and are negative.   Past Medical History:  Diagnosis Date   Anemia    Anxiety    Depression    GERD (gastroesophageal reflux disease)    Hyperlipidemia    Hypertension    Insomnia    Substance abuse (HCC)    Past Surgical History:  Procedure Laterality Date   COLONOSCOPY  2012   TUBAL LIGATION      Social History:  reports that she has been smoking cigarettes. She has been exposed to tobacco smoke. She has never used smokeless tobacco. She reports current alcohol use of about 16.0 standard drinks of alcohol per week. She reports that she does not currently use drugs after having used the following drugs: Marijuana.   Allergies  Allergen Reactions   Alprazolam Hives   Alprazolam Hives   Crestor [Rosuvastatin Calcium] Other (See Comments)    Joint pain    Family History  Problem Relation Age of Onset   Other Mother        lou gehrigs disease   Breast cancer Sister    Aneurysm Sister    Cancer Paternal Grandmother    Colon cancer Neg Hx    Colon polyps Neg Hx    Crohn's disease Neg Hx  Esophageal cancer Neg Hx    Rectal cancer Neg Hx    Stomach cancer Neg Hx       Prior to Admission medications   Medication Sig Start Date End Date Taking? Authorizing Provider  aspirin EC 81 MG tablet Take 81 mg by mouth daily. Swallow whole. Patient not taking: Reported on 06/28/2024    [provider]  atenolol -chlorthalidone  (TENORETIC ) 50-25 MG tablet TAKE 1 TABLET BY MOUTH EVERY DAY 08/24/24   Tanda Bleacher, MD  atorvastatin (LIPITOR) 40 MG tablet Take 1 tablet (40 mg total) by mouth daily. 09/22/24   Tanda Bleacher, MD  baclofen  (LIORESAL ) 10 MG tablet Take 1 tablet (10 mg total) by mouth 3 (three) times daily. Patient not taking: Reported on 09/22/2024  05/29/24   Johnie Flaming A, NP  lidocaine  4 % Place 1 patch onto the skin daily. 05/29/24   Johnie Flaming A, NP  meloxicam  (MOBIC ) 7.5 MG tablet TAKE 1 TABLET BY MOUTH EVERY DAY Patient not taking: Reported on 09/22/2024 11/24/23   Lorren Greig PARAS, NP  meloxicam  (MOBIC ) 7.5 MG tablet Take 1 tablet (7.5 mg total) by mouth daily. Patient not taking: Reported on 09/22/2024 11/24/23   Lorren Greig PARAS, NP  omeprazole  (PRILOSEC) 40 MG capsule TAKE 1 CAPSULE (40 MG TOTAL) BY MOUTH DAILY. 09/23/24   Tanda Bleacher, MD  potassium chloride  SA (KLOR-CON  M) 20 MEQ tablet TAKE 1 TABLET BY MOUTH TWICE A DAY 09/23/24   Tanda Bleacher, MD  pravastatin  (PRAVACHOL ) 20 MG tablet TAKE 1 TABLET BY MOUTH EVERY DAY 07/12/24   Lorren Greig PARAS, NP  traZODone  (DESYREL ) 150 MG tablet TAKE 1 TABLET BY MOUTH AT BEDTIME. 08/01/23   Lorren Greig PARAS, NP    Physical Exam: BP 137/83 (BP Location: Left Arm)   Pulse (!) 57   Temp 98.1 F (36.7 C) (Oral)   Resp 15   SpO2 98%   General: 63 y.o. year-old female well developed well nourished in no acute distress.  Alert and oriented x3. Cardiovascular: Regular rate and rhythm with no rubs or gallops.  No thyromegaly or JVD noted.  No lower extremity edema. 2/4 pulses in all 4 extremities. Respiratory: Clear to auscultation with no wheezes or rales. Good inspiratory effort. Abdomen: Soft nontender nondistended with normal bowel sounds x4 quadrants. Muskuloskeletal: No cyanosis, clubbing or edema noted bilaterally Neuro: CN II-XII intact, strength, sensation, reflexes Skin: No ulcerative lesions noted or rashes Psychiatry: Judgement and insight appear normal. Mood is appropriate for condition and setting          Labs on Admission:  Basic Metabolic Panel: Recent Labs  Lab 10/23/24 1623  NA 140  K 3.4*  CL 104  CO2 25  GLUCOSE 97  BUN 11  CREATININE 1.08*  CALCIUM 8.7*   Liver Function Tests: Recent Labs  Lab 10/23/24 1623  AST 23  ALT 12  ALKPHOS 41  BILITOT <0.2   PROT 5.4*  ALBUMIN 3.6   No results for input(s): LIPASE, AMYLASE in the last 168 hours. No results for input(s): AMMONIA in the last 168 hours. CBC: Recent Labs  Lab 10/23/24 1524  WBC 5.8  NEUTROABS 3.0  HGB 16.0*  HCT 45.2  MCV 90.9  PLT 409*   Cardiac Enzymes: No results for input(s): CKTOTAL, CKMB, CKMBINDEX, TROPONINI in the last 168 hours.  BNP (last 3 results) No results for input(s): BNP in the last 8760 hours.  ProBNP (last 3 results) No results for input(s): PROBNP in the last 8760 hours.  CBG: Recent Labs  Lab 10/23/24 1502  GLUCAP 87    Radiological Exams on Admission: MR BRAIN WO CONTRAST Result Date: 10/23/2024 EXAM: MRI BRAIN WITHOUT CONTRAST 10/23/2024 05:48:24 PM TECHNIQUE: Multiplanar multisequence MRI of the head/brain was performed without the administration of intravenous contrast. COMPARISON: 07/31/2023 CLINICAL HISTORY: Transient ischemic attack (TIA). FINDINGS: BRAIN AND VENTRICLES: No acute infarct. No intracranial hemorrhage. No mass. No midline shift. No hydrocephalus. The sella is unremarkable. Normal flow voids. ORBITS: No acute abnormality. SINUSES AND MASTOIDS: No acute abnormality. BONES AND SOFT TISSUES: Normal marrow signal. No acute soft tissue abnormality. IMPRESSION: 1. No acute intracranial abnormality. Electronically signed by: Franky Stanford MD 10/23/2024 06:06 PM EDT RP Workstation: [id number]   CT ANGIO HEAD NECK W WO CM (CODE STROKE) Result Date: 10/23/2024 EXAM: CTA Head and Neck with Intravenous Contrast. CT Head without Contrast. CLINICAL HISTORY: Neuro deficit, acute, stroke suspected. TECHNIQUE: Axial CTA images of the head and neck performed with intravenous contrast. MIP reconstructed images were created and reviewed. Axial computed tomography images of the head/brain performed without intravenous contrast. Note: Per PQRS, the description of internal carotid artery percent stenosis, including 0 percent or normal  exam, is based on [place] American Symptomatic Carotid Endarterectomy Trial (NASCET) criteria. Dose reduction technique was used including one or more of the following: automated exposure control, adjustment of mA and kV according to patient size, and/or iterative reconstruction. CONTRAST: 75mL iohexol  (OMNIPAQUE ) 350 MG/ML injection 75 mL IOHEXOL  350 MG/ML SOLN. COMPARISON: None provided. FINDINGS: BRAIN: No acute intraparenchymal hemorrhage. No mass lesion. No CT evidence for acute territorial infarct. No midline shift or extra-axial collection. VENTRICLES: No hydrocephalus. ORBITS: The orbits are unremarkable. SINUSES AND MASTOIDS: The paranasal sinuses and mastoid air cells are clear. COMMON CAROTID ARTERIES: No significant stenosis. No dissection or occlusion. INTERNAL CAROTID ARTERIES: No stenosis by NASCET criteria. No dissection or occlusion. VERTEBRAL ARTERIES: No significant stenosis. No dissection or occlusion. ANTERIOR CEREBRAL ARTERIES: No significant stenosis. No occlusion. No aneurysm. MIDDLE CEREBRAL ARTERIES: No significant stenosis. No occlusion. No aneurysm. POSTERIOR CEREBRAL ARTERIES: No significant stenosis. No occlusion. No aneurysm. BASILAR ARTERY: No significant stenosis. No occlusion. No aneurysm. SOFT TISSUES: No acute finding. No masses or lymphadenopathy. BONES: No acute osseous abnormality. IMPRESSION: 1. No acute intracranial hemorrhage or ischemic change. 2. No evidence of significant stenosis, aneurysmal dilatation, or dissection involving the arteries of the head and neck. Electronically signed by: Franky Stanford MD 10/23/2024 03:49 PM EDT RP Workstation: [id number]   CT HEAD CODE STROKE WO CONTRAST Result Date: 10/23/2024 EXAM: CT HEAD WITHOUT CONTRAST 10/23/2024 03:11:29 PM TECHNIQUE: CT of the head was performed without the administration of intravenous contrast. Automated exposure control, iterative reconstruction, and/or weight based adjustment of the mA/kV was utilized to  reduce the radiation dose to as low as reasonably achievable. COMPARISON: Comparison 08/15/2024. CLINICAL HISTORY: Neuro deficit, acute, stroke suspected. FINDINGS: BRAIN AND VENTRICLES: No acute hemorrhage. No evidence of acute infarct. No hydrocephalus. No extra-axial collection. No mass effect or midline shift. [place] Stroke Program Early CT Score (ASPECTS) Ganglionic (caudate, ic, lentiform nucleus, insula, M1-m3): 7 Supraganglionic (m4-m6): 3 Total: 10 ORBITS: No acute abnormality. SINUSES: No acute abnormality. SOFT TISSUES AND SKULL: No acute soft tissue abnormality. No skull fracture. IMPRESSION: 1. No acute intracranial abnormality. 2. ASPECTS: 10. Findings discussed with Dr. Lynwood Moulder at 3:17 pm on 11/03/24 Electronically signed by: Franky Stanford MD 10/23/2024 03:18 PM EDT RP Workstation: [id number]    EKG: I independently viewed the EKG done and my findings are  as followed: Sinus rhythm rate of 63.  Nonspecific ST-T changes.  TC 473.  Assessment/Plan Present on Admission:  TIA (transient ischemic attack)  Principal Problem:   TIA (transient ischemic attack)  TIA Risk factors, hypertension, hyperlipidemia, current tobacco use Noncontrast head CT was nonacute.  CT angio head and neck showed no LVO.   MRI brain nonacute Monitor on telemetry Follow-up 2D echo Was taking daily aspirin for the past 1 month. Defer antiplatelet choice and regimen to neurology. High intensity statin  Hyperlipidemia Follow fasting lipid panel Goal LDL less than 70 High intensity statin  Tobacco use disorder Recommend complete tobacco cessation Counseling done at bedside. The patient was receptive.  Alcohol use disorder Recommend complete alcohol cessation Counseled on at bedside-the patient was receptive. No evidence of alcohol withdrawal at the time of this visit.  Hypokalemia Serum potassium 3.4 Repleted orally Obtain magnesium  level in the morning and repeat BMP  Erythrocytosis,  suspect secondary type from ongoing tobacco use May consider tobacco cessation versus phlebotomy Gentle IV fluid hydration Continue aspirin Repeat CBC in the morning   Time: 75 minutes.   DVT prophylaxis: Subcu Lovenox daily.  Code Status: Full code.  Family Communication: None at bedside.  Disposition Plan: Admitted to telemetry unit.  Consults called: Seen by tele-neurology.  Admission status: Observation status.   Status is: Observation    Romas LOISE Hurst MD [facility] Pager [phone]  If 7PM-7AM, please contact night-coverage [email or web address] Password [email or web address]  10/23/2024, 10:13 PM

## 2024-10-23 NOTE — ED Notes (Signed)
 Patient transported to MRI

## 2024-10-23 NOTE — ED Notes (Signed)
 Pt en route to [facility] w/ [facility] at this time

## 2024-10-23 NOTE — ED Notes (Signed)
 I was just notified by our lab that the ethanol test will need to be shipped to another [facility] facility, therefore it will necessarily be delayed.

## 2024-10-24 ENCOUNTER — Other Ambulatory Visit: Payer: Self-pay

## 2024-10-24 ENCOUNTER — Encounter (HOSPITAL_COMMUNITY): Payer: Self-pay | Admitting: Internal Medicine

## 2024-10-24 ENCOUNTER — Observation Stay (HOSPITAL_COMMUNITY)

## 2024-10-24 DIAGNOSIS — R200 Anesthesia of skin: Secondary | ICD-10-CM | POA: Diagnosis not present

## 2024-10-24 DIAGNOSIS — F17210 Nicotine dependence, cigarettes, uncomplicated: Secondary | ICD-10-CM | POA: Diagnosis not present

## 2024-10-24 DIAGNOSIS — I10 Essential (primary) hypertension: Secondary | ICD-10-CM

## 2024-10-24 DIAGNOSIS — G459 Transient cerebral ischemic attack, unspecified: Secondary | ICD-10-CM

## 2024-10-24 DIAGNOSIS — E785 Hyperlipidemia, unspecified: Secondary | ICD-10-CM | POA: Diagnosis not present

## 2024-10-24 LAB — ECHOCARDIOGRAM COMPLETE
Area-P 1/2: 4.89 cm2
S' Lateral: 2.9 cm

## 2024-10-24 LAB — LIPID PANEL
Cholesterol: 151 mg/dL (ref 0–200)
HDL: 58 mg/dL (ref >40)
LDL Cholesterol: 66 mg/dL (ref 0–99)
Total CHOL/HDL Ratio: 2.6 ratio
Triglycerides: 134 mg/dL (ref <150)
VLDL: 27 mg/dL (ref 0–40)

## 2024-10-24 LAB — BASIC METABOLIC PANEL WITH GFR
Anion gap: 12 (ref 5–15)
BUN: 9 mg/dL (ref 8–23)
CO2: 24 mmol/L (ref 22–32)
Calcium: 8.5 mg/dL — ABNORMAL LOW (ref 8.9–10.3)
Chloride: 104 mmol/L (ref 98–111)
Creatinine, Ser: 1.2 mg/dL — ABNORMAL HIGH (ref 0.44–1.00)
GFR, Estimated: 51 mL/min — ABNORMAL LOW (ref >60)
Glucose, Bld: 94 mg/dL (ref 70–99)
Potassium: 3 mmol/L — ABNORMAL LOW (ref 3.5–5.1)
Sodium: 140 mmol/L (ref 135–145)

## 2024-10-24 LAB — HEMOGLOBIN A1C
Hgb A1c MFr Bld: 5.1 % (ref 4.8–5.6)
Mean Plasma Glucose: 99.67 mg/dL

## 2024-10-24 LAB — MAGNESIUM: Magnesium: 1.6 mg/dL — ABNORMAL LOW (ref 1.7–2.4)

## 2024-10-24 LAB — CBC
HCT: 39.5 % (ref 36.0–46.0)
Hemoglobin: 13.9 g/dL (ref 12.0–15.0)
MCH: 32.1 pg (ref 26.0–34.0)
MCHC: 35.2 g/dL (ref 30.0–36.0)
MCV: 91.2 fL (ref 80.0–100.0)
Platelets: 337 K/uL (ref 150–400)
RBC: 4.33 MIL/uL (ref 3.87–5.11)
RDW: 13.4 % (ref 11.5–15.5)
WBC: 6.9 K/uL (ref 4.0–10.5)
nRBC: 0 % (ref 0.0–0.2)

## 2024-10-24 LAB — HIV ANTIBODY (ROUTINE TESTING W REFLEX): HIV Screen 4th Generation wRfx: NONREACTIVE

## 2024-10-24 LAB — VITAMIN B12: Vitamin B-12: 355 pg/mL (ref 180–914)

## 2024-10-24 LAB — FOLATE: Folate: >20 ng/mL (ref >5.9)

## 2024-10-24 LAB — PHOSPHORUS: Phosphorus: 3.9 mg/dL (ref 2.5–4.6)

## 2024-10-24 MED ORDER — LORAZEPAM 2 MG/ML IJ SOLN: 1.0000 mg | INTRAMUSCULAR | Status: DC | PRN

## 2024-10-24 MED ORDER — THIAMINE MONONITRATE 100 MG PO TABS: 100.0000 mg | ORAL_TABLET | Freq: Every day | ORAL | Status: DC

## 2024-10-24 MED ORDER — POTASSIUM CHLORIDE 20 MEQ PO PACK
40.0000 meq | PACK | Freq: Two times a day (BID) | ORAL | Status: AC
  Administered 2024-10-24 (×2): 40 meq via ORAL
  Filled 2024-10-24 (×2): qty 2

## 2024-10-24 MED ORDER — THIAMINE MONONITRATE 100 MG PO TABS
100.0000 mg | ORAL_TABLET | Freq: Every day | ORAL | Status: DC
Start: 2024-10-24 — End: 2024-10-25
  Administered 2024-10-24 – 2024-10-25 (×2): 100 mg via ORAL
  Filled 2024-10-24 (×2): qty 1

## 2024-10-24 MED ORDER — CHLORDIAZEPOXIDE HCL 5 MG PO CAPS
10.0000 mg | ORAL_CAPSULE | Freq: Three times a day (TID) | ORAL | Status: DC
  Administered 2024-10-24 – 2024-10-25 (×4): 10 mg via ORAL
  Filled 2024-10-24 (×4): qty 2

## 2024-10-24 MED ORDER — ADULT MULTIVITAMIN W/MINERALS CH
1.0000 | ORAL_TABLET | Freq: Every day | ORAL | Status: DC
  Administered 2024-10-24 – 2024-10-25 (×2): 1 via ORAL
  Filled 2024-10-24 (×2): qty 1

## 2024-10-24 MED ORDER — MAGNESIUM SULFATE 4 GM/100ML IV SOLN
4.0000 g | Freq: Once | INTRAVENOUS | Status: AC
  Administered 2024-10-24: 4 g via INTRAVENOUS
  Filled 2024-10-24 (×2): qty 100

## 2024-10-24 MED ORDER — THIAMINE HCL 100 MG/ML IJ SOLN: 100.0000 mg | Freq: Every day | INTRAMUSCULAR | Status: DC

## 2024-10-24 MED ORDER — FOLIC ACID 1 MG PO TABS
1.0000 mg | ORAL_TABLET | Freq: Every day | ORAL | Status: DC
  Administered 2024-10-24 – 2024-10-25 (×2): 1 mg via ORAL
  Filled 2024-10-24 (×2): qty 1

## 2024-10-24 MED ORDER — LORAZEPAM 1 MG PO TABS: 1.0000 mg | ORAL_TABLET | ORAL | Status: DC | PRN

## 2024-10-24 NOTE — TOC Initial Note (Signed)
 Transition of Care [facility]) - Initial/Assessment Note    Patient Details  Name: Teresa Cabrera MRN: [id number] Date of Birth: 1961/03/10  [facility] CM/SW Contact:    Marval Gell, RN Phone Number: 10/24/2024, 7:40 AM  Clinical Narrative:                  Per chart review Patient in obs from home, lives with spouse, work up for TIA. PT OT pending, no Hx of HH services noted in system.  Will follow   Barriers to Discharge: Continued Medical Work up   Patient Goals and CMS Choice Patient states their goals for this hospitalization and ongoing recovery are:: to go home          Expected Discharge Plan and Services   Discharge Planning Services: CM Consult   Living arrangements for the past 2 months: Single Family Home                                      Prior Living Arrangements/Services Living arrangements for the past 2 months: Single Family Home Lives with:: Spouse                   Activities of Daily Living   ADL Screening (condition at time of admission) Independently performs ADLs?: Yes (appropriate for developmental age) Is the patient deaf or have difficulty hearing?: No Does the patient have difficulty seeing, even when wearing glasses/contacts?: No Does the patient have difficulty concentrating, remembering, or making decisions?: No  Permission Sought/Granted                  Emotional Assessment              Admission diagnosis:  TIA (transient ischemic attack) [G45.9] Patient Active Problem List   Diagnosis Date Noted   TIA (transient ischemic attack) 10/23/2024   Bad odor of urine 02/03/2023   Encounter for IUD removal 12/28/2019   Abnormal mammogram of right breast 06/10/2016   Family history of breast cancer in sister 06/10/2016   Nonspecific immunological findings 02/04/2014   Hypercholesterolemia 01/18/2014   Vitamin D deficiency 01/18/2014   Nondependent alcohol abuse 02/02/2013   Essential  hypertension 02/02/2013   DUB (dysfunctional uterine bleeding) 04/09/2012   Alcohol abuse 04/09/2012   Smoker 04/09/2012   HTN (hypertension) 04/09/2012   Current smoker 04/09/2012   Dysfunctional uterine bleeding 04/09/2012   Current smoker 04/09/2012   Insomnia    PCP:  Tanda Bleacher, MD Pharmacy:   [id number] - [place], [place] - [address] [place] [address] Phone: [phone] Fax: [phone]     Social Drivers of Health (SDOH) Social History: SDOH Screenings   Food Insecurity: No Food Insecurity (10/23/2024)  Housing: Low Risk  (10/23/2024)  Transportation Needs: No Transportation Needs (10/23/2024)  Utilities: Not At Risk (10/23/2024)  Alcohol Screen: Low Risk  (06/28/2024)  Depression (PHQ2-9): Low Risk  (06/28/2024)  Financial Resource Strain: Low Risk  (06/28/2024)  Physical Activity: Inactive (06/28/2024)  Social Connections: Moderately Integrated (06/28/2024)  Stress: No Stress Concern Present (06/28/2024)  Tobacco Use: High Risk (10/23/2024)  Health Literacy: Adequate Health Literacy (06/28/2024)   SDOH Interventions:     Readmission Risk Interventions     No data to display

## 2024-10-24 NOTE — Consult Note (Addendum)
 NEUROLOGY CONSULT NOTE   Date of service: October 24, 2024 Patient Name: Teresa Cabrera MRN:  [id number] DOB:  1961/01/15 Chief Complaint: Left side numbness Requesting Provider: Dennise Lavada POUR, MD  History of Present Illness  Teresa Cabrera is a 63 y.o. female with hx of anxiety and depression, hypertension, hyperlipidemia, GERD, cigarette smoker, THC use, EtOH use who presented to outside ED for acute onset left-sided numbness.  She was seen by teleneurology who recommended full stroke workup.  MRI brain with no acute process She states that she has been having intermittent left side numbness for over a week where she was taking a baby aspirin.  Friday she endorses drinking half a pint of alcohol and smoking and went to bed in her normal state of health Saturday she woke up with numbness on the left side that got worse and she presented for evaluation.  She currently states that her numbness has resolved and she is back to her normal self  she also states that she had a similar presentation about a year ago.  She was seen in August 2024 for left-sided facial numbness that had been ongoing for a few days her K was noted to be low at 2.8 and was discharged home, MRI brain at that time was normal Interestingly in the current admission too potassium was low at 3 and magnesium  was 1.6.  This is being replaced LKW: Over a week Modified rankin score: 0-Completely asymptomatic and back to baseline post- stroke IV Thrombolysis:  No outside window EVT:  No LVO  NIHSS components Score: Comment  1a Level of Conscious 0[]  1[]  2[]  3[]      1b LOC Questions 0[]  1[]  2[]       1c LOC Commands 0[]  1[]  2[]       2 Best Gaze 0[]  1[]  2[]       3 Visual 0[]  1[]  2[]  3[]      4 Facial Palsy 0[]  1[]  2[]  3[]      5a Motor Arm - left 0[]  1[]  2[]  3[]  4[]  UN[]    5b Motor Arm - Right 0[]  1[]  2[]  3[]  4[]  UN[]    6a Motor Leg - Left 0[]  1[]  2[]  3[]  4[]  UN[]    6b Motor Leg - Right 0[]  1[]  2[]  3[]  4[]  UN[]    7 Limb Ataxia  0[]  1[]  2[]  UN[]      8 Sensory 0[]  1[]  2[]  UN[]      9 Best Language 0[]  1[]  2[]  3[]      10 Dysarthria 0[]  1[]  2[]  UN[]      11 Extinct. and Inattention 0[]  1[]  2[]       TOTAL: 0      ROS  Comprehensive ROS performed and pertinent positives documented in HPI   Past History   Past Medical History:  Diagnosis Date   Anemia    Anxiety    Depression    GERD (gastroesophageal reflux disease)    Hyperlipidemia    Hypertension    Insomnia    Substance abuse (HCC)     Past Surgical History:  Procedure Laterality Date   COLONOSCOPY  2012   TUBAL LIGATION      Family History: Family History  Problem Relation Age of Onset   Other Mother        lou gehrigs disease   Breast cancer Sister    Aneurysm Sister    Cancer Paternal Grandmother    Colon cancer Neg Hx    Colon polyps Neg Hx    Crohn's disease Neg Hx    Esophageal  cancer Neg Hx    Rectal cancer Neg Hx    Stomach cancer Neg Hx     Social History  reports that she has been smoking cigarettes. She has been exposed to tobacco smoke. She has never used smokeless tobacco. She reports current alcohol use of about 16.0 standard drinks of alcohol per week. She reports that she does not currently use drugs after having used the following drugs: Marijuana.  Allergies  Allergen Reactions   Alprazolam Hives   Alprazolam Hives   Crestor [Rosuvastatin Calcium] Other (See Comments)    Joint pain    Medications   Current Facility-Administered Medications:     stroke: early stages of recovery book, , Does not apply, Once, Hall, Carole N, DO   acetaminophen (TYLENOL) tablet 500 mg, 500 mg, Oral, Q6H PRN, Hall, Carole N, DO   aspirin EC tablet 81 mg, 81 mg, Oral, Daily, Hall, Carole N, DO   atorvastatin (LIPITOR) tablet 40 mg, 40 mg, Oral, Daily, Hall, Carole N, DO   enoxaparin (LOVENOX) injection 40 mg, 40 mg, Subcutaneous, Q24H, Hall, Carole N, DO   lactated ringers infusion, , Intravenous, Continuous, Shona Yonke SAILOR, DO, Last  Rate: 50 mL/hr at 10/23/24 2341, New Bag at 10/23/24 2341   melatonin tablet 5 mg, 5 mg, Oral, QHS PRN, Shona Lazaro N, DO, 5 mg at 10/23/24 2336   pantoprazole (PROTONIX) EC tablet 40 mg, 40 mg, Oral, Daily, Hall, Carole N, DO   polyethylene glycol (MIRALAX / GLYCOLAX) packet 17 g, 17 g, Oral, Daily PRN, Hall, Carole N, DO   potassium chloride  (KLOR-CON ) packet 20 mEq, 20 mEq, Oral, BID, Hall, Carole N, DO, 20 mEq at 10/23/24 2336   prochlorperazine (COMPAZINE) injection 5 mg, 5 mg, Intravenous, Q6H PRN, Shona, Carole N, DO   sodium chloride  flush (NS) 0.9 % injection 3 mL, 3 mL, Intravenous, Once, Jerrol Agent, MD  Vitals   Vitals:   10/23/24 2343 10/24/24 0000 10/24/24 0448 10/24/24 0751  BP: (!) 140/81 132/80 115/76 115/78  Pulse: 61 64 67   Resp: 15 16 (!) 23   Temp: 97.9 F (36.6 C) 98.2 F (36.8 C) 98.1 F (36.7 C) 97.9 F (36.6 C)  TempSrc: Oral Oral Oral Oral  SpO2: 98% 98% 97%     There is no height or weight on file to calculate BMI.   Physical Exam   Constitutional: Appears well-developed and well-nourished.  Psych: Affect appropriate to situation.  Eyes: No scleral injection.  HENT: No OP obstruction.  Head: Normocephalic.  Cardiovascular: Normal rate and regular rhythm.  Respiratory: Effort normal, non-labored breathing.  GI: Soft.  No distension. There is no tenderness.  Skin: WDI.   Neurologic Examination    Mental Status -  Level of arousal and orientation to time, place, and person were intact. Language including expression, naming, repetition, comprehension was assessed and found intact. Attention span and concentration were normal. Recent and remote memory were intact. Fund of Knowledge was assessed and was intact.  Cranial Nerves II - XII - II - Visual field intact OU. III, IV, VI - Extraocular movements intact. V - Facial sensation intact bilaterally. VII - Facial movement intact bilaterally. VIII - Hearing & vestibular intact  bilaterally. X - Palate elevates symmetrically. XI - Chin turning & shoulder shrug intact bilaterally. XII - Tongue protrusion intact.  Motor Strength - The patient's strength was normal in all extremities and pronator drift was absent.  Bulk was normal and fasciculations were absent.   Motor  Tone - Muscle tone was assessed at the neck and appendages and was normal. Sensory - Light touch, temperature/pinprick were assessed and were symmetrical.   Coordination - The patient had normal movements in the hands and feet with no ataxia or dysmetria.  Tremor was absent. Gait and Station - deferred.  Labs/Imaging/Neurodiagnostic studies   CBC:  Recent Labs  Lab 2024-11-01 1524 10/24/24 0344  WBC 5.8 6.9  NEUTROABS 3.0  --   HGB 16.0* 13.9  HCT 45.2 39.5  MCV 90.9 91.2  PLT 409* 337   Basic Metabolic Panel:  Lab Results  Component Value Date   NA 140 10/24/2024   K 3.0 (L) 10/24/2024   CO2 24 10/24/2024   GLUCOSE 94 10/24/2024   BUN 9 10/24/2024   CREATININE 1.20 (H) 10/24/2024   CALCIUM 8.5 (L) 10/24/2024   GFRNONAA 51 (L) 10/24/2024   Lipid Panel:  Lab Results  Component Value Date   LDLCALC 66 10/24/2024   HgbA1c:  Lab Results  Component Value Date   HGBA1C 5.1 10/24/2024   Urine Drug Screen:     Component Value Date/Time   LABOPIA NONE DETECTED 07/31/2023 1705   COCAINSCRNUR NONE DETECTED 07/31/2023 1705   LABBENZ NONE DETECTED 07/31/2023 1705   AMPHETMU NONE DETECTED 07/31/2023 1705   THCU NONE DETECTED 07/31/2023 1705   LABBARB NONE DETECTED 07/31/2023 1705    Alcohol Level     Component Value Date/Time   ETH <15 11-01-2024 1623   INR  Lab Results  Component Value Date   INR 1.0 07/31/2023   APTT  Lab Results  Component Value Date   APTT 28 07/31/2023   AED levels: No results found for: PHENYTOIN, ZONISAMIDE, LAMOTRIGINE, LEVETIRACETA  CT Head without contrast(Personally reviewed): No acute process  CT angio Head and Neck with  contrast(Personally reviewed): No LVO   MRI Brain(Personally reviewed): No acute process  2D echo EF 60 to 65% A1c 5.1 LDL 66 Potassium 3.0 Magnesium  1.6 ASSESSMENT   Teresa Cabrera is a 63 y.o. female anxiety and depression, hypertension, hyperlipidemia, GERD, cigarette smoker, THC use, EtOH use who presented to outside ED for acute onset left-sided numbness transiently in the setting of hypokalemia.  Interestingly similar presentation happened a year ago with low potassium and negative MRI.SABRA  She was seen by teleneurology who recommended full stroke workup.  MRI brain with no acute process Impression recurrent transient left body Hemi numbness of unclear etiology.-Strokelike episode with negative neuroimaging x 2+ episodes RECOMMENDATIONS   - Frequent neuro checks - Prophylactic therapy-Antiplatelet med: Aspirin - dose 81mg  and plavix 75mg  daily for 3 weeks then Plavix alone - Continue home statin - Risk factor modification - Telemetry monitoring - PT consult, OT consult, Speech consult -CIWA protocol - Check thiamine, B12, folate - Thiamine and folate daily - Check UDS  - Stroke team to follow ______________________________________________________________________    Signed, Karna DELENA Geralds, NP [facility]  I have personally obtained history,examined this patient, reviewed notes, independently viewed imaging studies, participated in medical decision making and plan of care.ROS completed by me personally and pertinent positives fully documented  I have made any additions or clarifications directly to the above note. Agree with note above.  Patient presented with recurrent transient episode of left body numbness which has resolved with negative neuroimaging.  Interestingly similar presentation a year ago also in the setting of hypokalemia and present admission also potassium low at 3.0 being replaced.  Recommend continue ongoing stroke workup and aggressive risk factor  modification.   Recommend aspirin alone for stroke prevention patient will discharge home after test.   I personally spent a total of 50 minutes in the care of the patient today including getting/reviewing separately obtained history, performing a medically appropriate exam/evaluation, counseling and educating, placing orders, referring and communicating with other health care professionals, documenting clinical information in the EHR, independently interpreting results, and coordinating care.        Eather Popp, MD Medical Director [facility] Pager: [phone] 10/24/2024 3:08 PM

## 2024-10-24 NOTE — Progress Notes (Signed)
 OT Cancellation Note  Patient Details Name: Teresa Cabrera MRN: [id number] DOB: 1961/12/13   Cancelled Treatment:    Reason Eval/Treat Not Completed: OT screened, no needs identified, will sign off (no acute OT needs per discussion with PT.)  Zaniya Mcaulay K, OTD, OTR/L [facility] [phone]   Laneta POUR Koonce 10/24/2024, 8:33 AM

## 2024-10-24 NOTE — Progress Notes (Addendum)
 PROGRESS NOTE                                                                                                                                                                                                             Patient Demographics:    Teresa Cabrera, is a 63 y.o. female, DOB - 07-Aug-1961, FMW:[id number]  Outpatient Primary MD for the patient is Tanda Bleacher, MD    LOS - 0  Admit date - 10/23/2024    Chief Complaint  Patient presents with   Numbness       Brief Narrative (HPI from H&P)    63 y.o. female with medical history significant for hyperlipidemia, hypertension, GERD, current tobacco user (3 to 4 cigarettes when she drinks alcohol), alcohol use disorder(now less than half a pint a day from previously 1 pint of alcohol per day- She is working on cutting back.), who initially presented to [facility] ED due to left-sided numbness.  Last known well was around 8 PM on 10/22/2024.  Endorses for the past 1 month has had numbness in her left lower extremity after waking up in the morning.  She would take 1 baby aspirin and then the numbness will go away.  Last night she went to bed around 8 PM and when she woke up at 8 AM she noticed that her left side was numb.  Endorses numbness in the left side of her face, left arm, and left leg.  Also felt like she was feeling weak in her left arm and leg.  No facial droop.  No difficulty with speech or swallowing.  Her maternal cousin was diagnosed with stroke recently.  She was worried, therefore she presented to the ER for further evaluation.  This is her second presentation this month for left-sided numbness.  In the ER neurology was consulted over the phone who recommended TIA workup.   Subjective:    [name] today has, No headache, No chest pain, No abdominal pain - No Nausea, No new weakness tingling or numbness, no shortness of breath   Assessment  & Plan :   TIA Left  sided tingling numbness and mild weakness lasting 2 to 3 hours.  Likely represents TIA, unremarkable MRI brain, CTA head and neck negative, stable A1c and LDL, symptoms have completely resolved await echocardiogram, strictly counseled to abstain from smoking  and alcohol.  Echo stable will discharge with outpatient neurology follow-up   Hyperlipidemia On statin continue at home dose, LDL at goal   Tobacco use disorder Recommend complete tobacco cessation  Alcohol use disorder Recommend complete alcohol cessation Start Librium and CIWA protocol   Hypokalemia hypomagnesemia Replaced   Erythrocytosis, suspect secondary type from ongoing tobacco use some dehydration Improved with supportive care and IV fluids        Condition - Fair  Family Communication  : None present  Code Status :   Full  Consults  :  Neuro  PUD Prophylaxis :     Procedures  :     Echocardiogram.  MRI brain and CTA head and neck nonacute.      Disposition Plan  :    Status is: Observation   DVT Prophylaxis  :    enoxaparin (LOVENOX) injection 40 mg Start: 10/24/24 1000    Lab Results  Component Value Date   PLT 337 10/24/2024    Diet :  Diet Order             Diet Heart Room service appropriate? Yes; Fluid consistency: Thin  Diet effective now                    Inpatient Medications  Scheduled Meds:   stroke: early stages of recovery book   Does not apply Once   aspirin EC  81 mg Oral Daily   atorvastatin  40 mg Oral Daily   chlordiazePOXIDE  10 mg Oral TID   enoxaparin (LOVENOX) injection  40 mg Subcutaneous Q24H   folic acid  1 mg Oral Daily   multivitamin with minerals  1 tablet Oral Daily   pantoprazole  40 mg Oral Daily   potassium chloride   40 mEq Oral BID   sodium chloride  flush  3 mL Intravenous Once   thiamine  100 mg Oral Daily   Or   thiamine  100 mg Intravenous Daily   Continuous Infusions:  magnesium  sulfate bolus IVPB     PRN Meds:.acetaminophen,  LORazepam **OR** LORazepam, melatonin, polyethylene glycol, prochlorperazine  Antibiotics  :    Anti-infectives (From admission, onward)    None         Objective:   Vitals:   10/23/24 2343 10/24/24 0000 10/24/24 0448 10/24/24 0751  BP: (!) 140/81 132/80 115/76 115/78  Pulse: 61 64 67   Resp: 15 16 (!) 23   Temp: 97.9 F (36.6 C) 98.2 F (36.8 C) 98.1 F (36.7 C) 97.9 F (36.6 C)  TempSrc: Oral Oral Oral Oral  SpO2: 98% 98% 97%     Wt Readings from Last 3 Encounters:  09/22/24 67 kg  06/28/24 68.3 kg  03/23/24 68.9 kg     Intake/Output Summary (Last 24 hours) at 10/24/2024 0915 Last data filed at 10/23/2024 2230 Gross per 24 hour  Intake 480 ml  Output --  Net 480 ml     Physical Exam  Awake Alert, No new F.N deficits, Normal affect [place].AT,PERRAL Supple Neck, No JVD,   Symmetrical Chest wall movement, Good air movement bilaterally, CTAB RRR,No Gallops,Rubs or new Murmurs,  +ve B.Sounds, Abd Soft, No tenderness,   No Cyanosis, Clubbing or edema       Data Review:    Recent Labs  Lab 10/23/24 1524 10/24/24 0344  WBC 5.8 6.9  HGB 16.0* 13.9  HCT 45.2 39.5  PLT 409* 337  MCV 90.9 91.2  MCH 32.2 32.1  MCHC  35.4 35.2  RDW 13.4 13.4  LYMPHSABS 2.4  --   MONOABS 0.3  --   EOSABS 0.1  --   BASOSABS 0.1  --     Recent Labs  Lab 10/23/24 1623 10/24/24 0344  NA 140 140  K 3.4* 3.0*  CL 104 104  CO2 25 24  ANIONGAP 12 12  GLUCOSE 97 94  BUN 11 9  CREATININE 1.08* 1.20*  AST 23  --   ALT 12  --   ALKPHOS 41  --   BILITOT <0.2  --   ALBUMIN 3.6  --   HGBA1C  --  5.1  MG  --  1.6*  PHOS  --  3.9  CALCIUM 8.7* 8.5*      Recent Labs  Lab 10/23/24 1623 10/24/24 0344  HGBA1C  --  5.1  MG  --  1.6*  CALCIUM 8.7* 8.5*    --------------------------------------------------------------------------------------------------------------- Lab Results  Component Value Date   CHOL 151 10/24/2024   HDL 58 10/24/2024   LDLCALC 66 10/24/2024    TRIG 134 10/24/2024   CHOLHDL 2.6 10/24/2024    Lab Results  Component Value Date   HGBA1C 5.1 10/24/2024   No results for input(s): TSH, T4TOTAL, FREET4, T3FREE, THYROIDAB in the last 72 hours. No results for input(s): VITAMINB12, FOLATE, FERRITIN, TIBC, IRON , RETICCTPCT in the last 72 hours. ------------------------------------------------------------------------------------------------------------------ Cardiac Enzymes No results for input(s): CKMB, TROPONINI, MYOGLOBIN in the last 168 hours.  Invalid input(s): CK  Micro Results No results found for this or any previous visit (from the past 240 hours).  Radiology Report MR BRAIN WO CONTRAST Result Date: 10/23/2024 EXAM: MRI BRAIN WITHOUT CONTRAST 10/23/2024 05:48:24 PM TECHNIQUE: Multiplanar multisequence MRI of the head/brain was performed without the administration of intravenous contrast. COMPARISON: 07/31/2023 CLINICAL HISTORY: Transient ischemic attack (TIA). FINDINGS: BRAIN AND VENTRICLES: No acute infarct. No intracranial hemorrhage. No mass. No midline shift. No hydrocephalus. The sella is unremarkable. Normal flow voids. ORBITS: No acute abnormality. SINUSES AND MASTOIDS: No acute abnormality. BONES AND SOFT TISSUES: Normal marrow signal. No acute soft tissue abnormality. IMPRESSION: 1. No acute intracranial abnormality. Electronically signed by: Franky Stanford MD 10/23/2024 06:06 PM EDT RP Workstation: [id number]   CT ANGIO HEAD NECK W WO CM (CODE STROKE) Result Date: 10/23/2024 EXAM: CTA Head and Neck with Intravenous Contrast. CT Head without Contrast. CLINICAL HISTORY: Neuro deficit, acute, stroke suspected. TECHNIQUE: Axial CTA images of the head and neck performed with intravenous contrast. MIP reconstructed images were created and reviewed. Axial computed tomography images of the head/brain performed without intravenous contrast. Note: Per PQRS, the description of internal carotid artery  percent stenosis, including 0 percent or normal exam, is based on [employer] American Symptomatic Carotid Endarterectomy Trial (NASCET) criteria. Dose reduction technique was used including one or more of the following: automated exposure control, adjustment of mA and kV according to patient size, and/or iterative reconstruction. CONTRAST: 75mL iohexol  (OMNIPAQUE ) 350 MG/ML injection 75 mL IOHEXOL  350 MG/ML SOLN. COMPARISON: None provided. FINDINGS: BRAIN: No acute intraparenchymal hemorrhage. No mass lesion. No CT evidence for acute territorial infarct. No midline shift or extra-axial collection. VENTRICLES: No hydrocephalus. ORBITS: The orbits are unremarkable. SINUSES AND MASTOIDS: The paranasal sinuses and mastoid air cells are clear. COMMON CAROTID ARTERIES: No significant stenosis. No dissection or occlusion. INTERNAL CAROTID ARTERIES: No stenosis by NASCET criteria. No dissection or occlusion. VERTEBRAL ARTERIES: No significant stenosis. No dissection or occlusion. ANTERIOR CEREBRAL ARTERIES: No significant stenosis. No occlusion. No aneurysm. MIDDLE CEREBRAL ARTERIES: No  significant stenosis. No occlusion. No aneurysm. POSTERIOR CEREBRAL ARTERIES: No significant stenosis. No occlusion. No aneurysm. BASILAR ARTERY: No significant stenosis. No occlusion. No aneurysm. SOFT TISSUES: No acute finding. No masses or lymphadenopathy. BONES: No acute osseous abnormality. IMPRESSION: 1. No acute intracranial hemorrhage or ischemic change. 2. No evidence of significant stenosis, aneurysmal dilatation, or dissection involving the arteries of the head and neck. Electronically signed by: Franky Stanford MD 10/23/2024 03:49 PM EDT RP Workstation: [id number]   CT HEAD CODE STROKE WO CONTRAST Result Date: 10/23/2024 EXAM: CT HEAD WITHOUT CONTRAST 10/23/2024 03:11:29 PM TECHNIQUE: CT of the head was performed without the administration of intravenous contrast. Automated exposure control, iterative reconstruction, and/or weight  based adjustment of the mA/kV was utilized to reduce the radiation dose to as low as reasonably achievable. COMPARISON: Comparison 08/15/2024. CLINICAL HISTORY: Neuro deficit, acute, stroke suspected. FINDINGS: BRAIN AND VENTRICLES: No acute hemorrhage. No evidence of acute infarct. No hydrocephalus. No extra-axial collection. No mass effect or midline shift. [place] Stroke Program Early CT Score (ASPECTS) Ganglionic (caudate, ic, lentiform nucleus, insula, M1-m3): 7 Supraganglionic (m4-m6): 3 Total: 10 ORBITS: No acute abnormality. SINUSES: No acute abnormality. SOFT TISSUES AND SKULL: No acute soft tissue abnormality. No skull fracture. IMPRESSION: 1. No acute intracranial abnormality. 2. ASPECTS: 10. Findings discussed with Dr. Lynwood Moulder at 3:17 pm on 11/03/24 Electronically signed by: Franky Stanford MD 10/23/2024 03:18 PM EDT RP Workstation: [id number]     Signature  -   Lavada Stank M.D on 10/24/2024 at 9:15 AM   -  To page go to [email or web address]

## 2024-10-24 NOTE — Evaluation (Signed)
 Physical Therapy Evaluation Patient Details Name: Teresa Cabrera MRN: [id number] DOB: June 09, 1961 Today's Date: 10/24/2024  History of Present Illness  63 y.o. female [facility] to [facility] 10/23/24 with L sided numbness. MRI brain negative for acute findings. PMHx:  hyperlipidemia, hypertension, GERD, current tobacco user, alcohol use disorder  Clinical Impression  PTA pt was independent for mobility and ADLs with no AD. Pt presents at functional mobility baseline with ability to ambulate 533ft independently with no AD. Only residual symptom is slightly decreased sensation in L LE. Pt has intermittent assist available upon d/c home. Pt has no further questions/concerns and feels comfortable d/c home whenever medically stable. No further acute or post-acute PT needs with Acute PT signing off.       If plan is discharge home, recommend the following: Assist for transportation   Can travel by private vehicle    Yes    Equipment Recommendations None recommended by PT     Functional Status Assessment Patient has not had a recent decline in their functional status     Precautions / Restrictions Precautions Precautions: None Restrictions Weight Bearing Restrictions Per Provider Order: No      Mobility  Bed Mobility Overal bed mobility: Independent     Transfers Overall transfer level: Independent Equipment used: None   Ambulation/Gait Ambulation/Gait assistance: Independent Gait Distance (Feet): 500 Feet Assistive device: None Gait Pattern/deviations: WFL(Within Functional Limits)     Modified Rankin (Stroke Patients Only) Modified Rankin (Stroke Patients Only) Pre-Morbid Rankin Score: No symptoms Modified Rankin: No significant disability     Balance Overall balance assessment: No apparent balance deficits (not formally assessed)            Pertinent Vitals/Pain Pain Assessment Pain Assessment: No/denies pain    Home Living Family/patient expects to be discharged to::  Private residence Living Arrangements: Spouse/significant other Available Help at Discharge: Family;Available PRN/intermittently Type of Home: House Home Access: Stairs to enter Entrance Stairs-Rails: Doctor, General Practice of Steps: 3   Home Layout: One level Home Equipment: None      Prior Function Prior Level of Function : Independent/Modified Independent;Driving;Working/employed      Mobility Comments: Ind with no AD       Extremity/Trunk Assessment   Upper Extremity Assessment Upper Extremity Assessment: Overall WFL for tasks assessed    Lower Extremity Assessment Lower Extremity Assessment: LLE deficits/detail LLE Deficits / Details: grossly 5/5 LLE Sensation: decreased light touch    Cervical / Trunk Assessment Cervical / Trunk Assessment: Normal  Communication   Communication Communication: No apparent difficulties    Cognition Arousal: Alert Behavior During Therapy: WFL for tasks assessed/performed   PT - Cognitive impairments: No apparent impairments      Following commands: Intact       Cueing Cueing Techniques: Verbal cues      PT Assessment Patient does not need any further PT services         PT Goals (Current goals can be found in the Care Plan section)  Acute Rehab PT Goals PT Goal Formulation: All assessment and education complete, DC therapy     AM-PAC PT 6 Clicks Mobility  Outcome Measure Help needed turning from your back to your side while in a flat bed without using bedrails?: None Help needed moving from lying on your back to sitting on the side of a flat bed without using bedrails?: None Help needed moving to and from a bed to a chair (including a wheelchair)?: None Help needed standing up from a chair  using your arms (e.g., wheelchair or bedside chair)?: None Help needed to walk in hospital room?: None Help needed climbing 3-5 steps with a railing? : None 6 Click Score: 24    End of Session   Activity  Tolerance: Patient tolerated treatment well Patient left: in bed;with call bell/phone within reach Nurse Communication: Mobility status PT Visit Diagnosis: Other abnormalities of gait and mobility (R26.89)    Time: 9194-9174 PT Time Calculation (min) (ACUTE ONLY): 20 min   Charges:   PT Evaluation $PT Eval Low Complexity: 1 Low   PT General Charges $$ ACUTE PT VISIT: 1 Visit       Kate ORN, PT, DPT Secure Chat Preferred  Rehab Office [phone]  Kate BRAVO Wendolyn 10/24/2024, 9:52 AM

## 2024-10-24 NOTE — Plan of Care (Signed)

## 2024-10-25 ENCOUNTER — Other Ambulatory Visit (HOSPITAL_COMMUNITY): Payer: Self-pay

## 2024-10-25 DIAGNOSIS — G459 Transient cerebral ischemic attack, unspecified: Secondary | ICD-10-CM | POA: Diagnosis not present

## 2024-10-25 LAB — BASIC METABOLIC PANEL WITH GFR
Anion gap: 11 (ref 5–15)
BUN: 5 mg/dL — ABNORMAL LOW (ref 8–23)
CO2: 23 mmol/L (ref 22–32)
Calcium: 8.3 mg/dL — ABNORMAL LOW (ref 8.9–10.3)
Chloride: 104 mmol/L (ref 98–111)
Creatinine, Ser: 1.08 mg/dL — ABNORMAL HIGH (ref 0.44–1.00)
GFR, Estimated: 57 mL/min — ABNORMAL LOW (ref >60)
Glucose, Bld: 107 mg/dL — ABNORMAL HIGH (ref 70–99)
Potassium: 3.4 mmol/L — ABNORMAL LOW (ref 3.5–5.1)
Sodium: 138 mmol/L (ref 135–145)

## 2024-10-25 LAB — MAGNESIUM: Magnesium: 1.9 mg/dL (ref 1.7–2.4)

## 2024-10-25 MED ORDER — ATENOLOL 50 MG PO TABS
50.0000 mg | ORAL_TABLET | Freq: Every day | ORAL | 11 refills | Status: AC
  Filled 2024-10-25: qty 20, 20d supply, fill #0
  Filled 2024-11-10: qty 30, 30d supply, fill #1
  Filled 2024-11-11: qty 30, 30d supply, fill #0
  Filled 2024-11-11: qty 20, 20d supply, fill #1

## 2024-10-25 MED ORDER — FOLIC ACID 1 MG PO TABS
1.0000 mg | ORAL_TABLET | Freq: Every day | ORAL | 0 refills | Status: DC
  Filled 2024-10-25: qty 30, 30d supply, fill #0

## 2024-10-25 MED ORDER — POTASSIUM CHLORIDE 20 MEQ PO PACK
40.0000 meq | PACK | Freq: Once | ORAL | Status: AC
  Administered 2024-10-25: 40 meq via ORAL
  Filled 2024-10-25: qty 2

## 2024-10-25 MED ORDER — THIAMINE HCL 100 MG PO TABS
100.0000 mg | ORAL_TABLET | Freq: Every day | ORAL | 0 refills | Status: DC
  Filled 2024-10-25: qty 30, 30d supply, fill #0

## 2024-10-25 MED ORDER — POTASSIUM CHLORIDE CRYS ER 20 MEQ PO TBCR
20.0000 meq | EXTENDED_RELEASE_TABLET | Freq: Every day | ORAL | 0 refills | Status: DC
  Filled 2024-10-25: qty 2, 2d supply, fill #0

## 2024-10-25 MED ORDER — CLOPIDOGREL BISULFATE 75 MG PO TABS
75.0000 mg | ORAL_TABLET | Freq: Every day | ORAL | 0 refills | Status: DC
  Filled 2024-10-25: qty 20, 20d supply, fill #0

## 2024-10-25 MED ORDER — CLOPIDOGREL BISULFATE 75 MG PO TABS
75.0000 mg | ORAL_TABLET | Freq: Every day | ORAL | Status: DC
Start: 2024-10-25 — End: 2024-10-25
  Administered 2024-10-25: 75 mg via ORAL
  Filled 2024-10-25: qty 1

## 2024-10-25 NOTE — Discharge Instructions (Addendum)
 Follow with Primary MD Tanda Bleacher, MD in 3 days   Get CBC, CMP, Magnesium , 2 view Chest X ray -  checked next visit with your primary MD    Activity: As tolerated with Full fall precautions use walker/cane & assistance as needed  Disposition Home     Diet: Heart Healthy    Special Instructions: If you have smoked or chewed Tobacco  in the last 2 yrs please stop smoking, stop any regular Alcohol  and or any Recreational drug use.  On your next visit with your primary care physician please Get Medicines reviewed and adjusted.  Please request your Prim.MD to go over all Hospital Tests and Procedure/Radiological results at the follow up, please get all Hospital records sent to your Prim MD by signing hospital release before you go home.  If you experience worsening of your admission symptoms, develop shortness of breath, life threatening emergency, suicidal or homicidal thoughts you must seek medical attention immediately by calling 911 or calling your MD immediately  if symptoms less severe.  You Must read complete instructions/literature along with all the possible adverse reactions/side effects for all the Medicines you take and that have been prescribed to you. Take any new Medicines after you have completely understood and accpet all the possible adverse reactions/side effects.   Do not drive when taking Pain medications.  Do not take more than prescribed Pain, Sleep and Anxiety Medications  Wear Seat belts while driving.     In a time of Crisis: Integrated family Services/Therapeutic Alternatives.  Mobile Crisis Management provides immediate crisis response, 24/7.  Call [phone] Depending on the county. [facility] for MH/DD/[facility] is available 24 hours a day, 7 days a week. Customer Service Specialists will assist you to find a crisis provider that is well-matched with your needs. Your local number is: [phone]  [facility] ([facility]) IOP, individual counseling, medication management [address] [place], [place] [address] [phone] Call for intake hours; Medicaid and Uninsured    Outpatient Providers  [facility] ([facility]) Group and individual counseling. [address], [place], [address] [phone] [place]: [phone]  [place]: [phone]  [facility] Offers IOP groups multiple times per week. [address] [place], [address] [phone] Takes Medicaid and other insurances.   [facility]  Chemical Dependency Intensive Outpatient Program (IOP) [address] [place], [place], [address] [phone]  [facility]  IOP and Partial Hospitalization Program  [address] [place], [address] [phone] Private Insurance, [place] only for partial hospitalization  [facility] [address], [address], [place], [address] [phone]  [facility] [phone] [address] [facility] ([facility]) IOP, individual counseling, medication management [address] [place], [address] [phone] Medicaid and [facility]  [facility] [address], [place], [address] [phone] Private Insurance and Self Pay   [facility] Outpatient [address] [place], [address] [phone]   [facility] Methadone Clinic  [address] [place], [address]  [facility]  [address], [place], [address] [phone]  [facility]  [address], [place], [address] [phone]  [facility]  Locations in [place], [place], [place], [place].  [phone] Accepts all insurances and some uninsured.  [facility]  Treatment [phone] Ages 76-64.  Accepts most insurances and some uninsured.    Residential Treatment Programs  [facility] [address] [place], [place] [address] [phone] or [phone] Detox and Residential Rehab 21 days (Medicaid, private insurance, and self pay. If Medicare, will look into funding). No methadone. Call for pre-screen.   [facility]) [address]  [place], [place] [address] [phone] Detox 3-7 days (self Pay and Medicaid Limited). Transitional Program for females needs 60 days clean first.  Rehab Only for Males 60 days (Medicare, and Self Pay)-No methadone.  [facility] [address] [place], [place] [address] [phone] or [phone] Private Insurance only  [facility] PHONE: [phone] FAX: [phone] Residential program for women 21 and over for up to a year through a Christian 12-step recovery model. Self-pay.    [facility] [address] [place], [place] [address] Phone:  [phone] Must be detoxed 72 hours prior to admission; 28 day program.  Self-pay.  [facility] [address]  [place], [place] [phone] [employer], Medicare, [place] (not straight [place]). They offer assistance with transportation.   [facility] is a substance use disorder treatment program for women, including those who are pregnant, parenting, and/or those experiencing abuse and violence. [phone]  [facility] [address] [place], [place] [address] Women's: [phone] No Medicaid. No Methadone.   [facility]  [address]  [place], [place] [address] [phone] Treatment Only, must make assessment appointment, and must be sober for assessment appointment. Self pay, [facility], must be [facility] resident. No methadone.   [facility]  [address] [place], [place] [address] [phone] No pending legal charges, Long-term work program. No methadone. Call for assessment.  [facility]  [address], [place], [place] [address] [phone] or [phone] Commercial Insurance Only  [facility] - [phone] Private Insurance (no [place]). Males/Females, call to make referrals, multiple facilities.   [facility] [employer] Program: [employer]  [address] [place], [place] [address] [phone] Must be able to climb stairs. No benzos or narcotics.  [facility]-no methadone: [facility] [address] [place], [place] [address] [phone] (f)  [facility] [phone] [place], [place] For women, houses 8 residents for sober living. No Medicaid.   [facility] Locations across the [place] (mainly [place] ) willing to help with transportation.  [phone] [employer].     AA Meetings Meeting Locator:  [email or web address] Also can download an app on that website.  [facility] Services Program Due to COVID-19, syringe services programs are likely operating under different hours with limited or no fixed site hours. Some programs may not be operating at all. Please contact the program directly using the phone numbers provided below to see if they are still operating under COVID-19. [facility] Solution to the Opioid Problem (GCSTOP) Fixed; mobile; peer-based [place] [phone] [email or web address] Fixed site exchange at [facility], [address] [place], [place] [address] on Wednesdays (2:00 - 5:00 pm) and Thursdays (4:00 - 8:00 pm). Pop-up mobile exchange locations: [employer], [address] [place], [place] [address] on Tuesdays (11:00 am - 1:00 pm) and Fridays  (11:00 am - 1:00 pm) -[facility] - [address], [place], [place] [address] on Tuesdays (2:00 - 4:00 pm) and  Fridays (2:00 - 4:00 pm) -[facility] -Fixed; mobile; peer-based [phone] [address] [place], [place] [address] Delivery and outreach available in [place] and [place], please call for more information. Monday, Tuesday: 1:00 -7:00 pm, Thursday: 4:00 pm - 8:00 pm, Friday: 1:00 pm - 8:00 pm) Medication-Assisted Treatment (MAT) -[facility]: Locations in [place], [place], [place], [place]. Accepts all insurances and some uninsured. Methadone, Suboxone, etc. Closed on weekends. Walk ins before 11am.  Call [phone]  -[facility]- services [place] and surrounding areas including [place], [place], [place], [place], [address], [address], [place], [place], [place], and [place], [place]. Options include Methadone, buprenorphine or Suboxone. [address] [place], [place] [address] Phone: [phone] [place] - Fri: 5:30am - 2:00pm, Sat: 5:30am -7:30am, Sun: Closed  -[facility]- We use FDA-approved medications, like methadone/suboxone/sublocade, and vivitrol. These medications are then combined with customized care plans that include individual or group counseling, toxicology, and medical care directed by on-site physicians. Accepts most insurance plans, Medicaid, and private pay.  [address] [place], [place] [address] Phone: [phone] Monday-Friday 5:00 AM - 10:00 AM, Saturday 6:00 AM - 8:30 AM, Sunday 6:00 AM - 7:00 AM  -[facility]- [facility] is a treatment & recovery focused program. In addition to receiving methadone medication, our clients participate in individual and group counseling as well as random drug testing. If accepted into the [facility] Opioid Program, you will be provided several intake appointments and a physical exam [address] [place], [place] [address] Office: [phone]  Fax: [phone]  -[facility]- We put our community members at the center of everything we do, for remote treatment services as well as in-person, from alcohol withdrawal to opioid use and more.  [address], [place], [place] [address] [phone] Monday-Wednesday: 9:00am - 5:00pm, Thursday: 9:00am - 6:00pm, Friday: 9:00am - 5:00pm Saturday: 9:00am - 1:00pm, Sunday: Closed  -[facility]- Our clinic in [place], a medication unit, offers daily dosing of methadone or buprenorphine in addition to our clinic in [place] offering counseling to help people overcome addiction to heroin and other opiates. We also offer psychiatric services including medication management, and an office based suboxone program. [address], [place], [place] [address] Phone [phone]  [facility]-We treat Opioid Addiction using medications that are a combination of buprenorphine and naloxone, which are used to treat adults addicted to narcotic painkillers and drugs such as heroin. They reduce the intense cravings and painful symptoms that accompany withdrawal. [address] [place], [place] Phone: [phone]  -[facility] (Or [place]): $12/daily for Methadone Treatment.  [address], [place], [place] [address] [phone]  [place] [phone] [address] [place], [place] [address]  -[facility]: Suboxone only.  [address], [place], [place] [address] P. [phone] -[facility] [address], [place], [place] [address] P. [phone]

## 2024-10-25 NOTE — Plan of Care (Signed)

## 2024-10-25 NOTE — Discharge Summary (Signed)
 Teresa Cabrera FMW:[id number] DOB: 1961-02-08 DOA: 10/23/2024  PCP: Tanda Bleacher, MD  Admit date: 10/23/2024  Discharge date: 10/25/2024  Admitted From: Home   Disposition:  Home   Recommendations for Outpatient Follow-up:   Follow up with PCP in 1-2 weeks  PCP Please obtain BMP/CBC, 2 view CXR in 1week,  (see Discharge instructions)   PCP Please follow up on the following pending results:     Home Health: None Equipment/Devices: None Consultations: Neurology Discharge Condition: Stable    CODE STATUS: Full    Diet Recommendation: Heart Healthy     Chief Complaint  Patient presents with   Numbness     Brief history of present illness from the day of admission and additional interim summary      63 y.o. female with medical history significant for hyperlipidemia, hypertension, GERD, current tobacco user (3 to 4 cigarettes when she drinks alcohol), alcohol use disorder(now less than half a pint a day from previously 1 pint of alcohol per day- She is working on cutting back.), who initially presented to [facility] ED due to left-sided numbness.  Last known well was around 8 PM on 10/22/2024.  Endorses for the past 1 month has had numbness in her left lower extremity after waking up in the morning.  She would take 1 baby aspirin and then the numbness will go away.  Last night she went to bed around 8 PM and when she woke up at 8 AM she noticed that her left side was numb.  Endorses numbness in the left side of her face, left arm, and left leg.  Also felt like she was feeling weak in her left arm and leg.  No facial droop.  No difficulty with speech or swallowing.  Her maternal cousin was diagnosed with stroke recently.  She was worried, therefore she presented to the ER for further evaluation.  This is her second  presentation this month for left-sided numbness.  In the ER neurology was consulted over the phone who recommended TIA workup.                                                                  Hospital Course   TIA Left sided tingling numbness and mild weakness lasting 2 to 3 hours.  Likely represents TIA, unremarkable MRI brain, CTA head and neck negative, stable A1c and LDL, stable echocardiogram, case discussed with neurologist Dr. Rosemarie 2 weeks of DAPT, continue home statin, LDL A1c at goal.  Symptoms completely resolved she is back to baseline strictly counseled to abstain from alcohol and cigarettes use.  Postdischarge follow-up with PCP and neurology.   Hyperlipidemia On statin continue at home dose, LDL at goal   Tobacco use disorder Recommend complete tobacco cessation   Alcohol use disorder Recommend complete alcohol cessation  No signs of DTs counseled to quit   Hypokalemia hypomagnesemia Replaced she was on a diuretic at home which has been stopped PCP to monitor BMP closely and adjust home blood pressure medications and potassium supplementation as needed.   Erythrocytosis, suspect secondary type from ongoing tobacco use some dehydration Improved with supportive care and IV fluids   Discharge diagnosis     Principal Problem:   TIA (transient ischemic attack)    Discharge instructions    Discharge Instructions     Diet - low sodium heart healthy   Complete by: As directed    Discharge instructions   Complete by: As directed    Follow with Primary MD Tanda Bleacher, MD in 3 days   Get CBC, CMP, Magnesium , 2 view Chest X ray -  checked next visit with your primary MD    Activity: As tolerated with Full fall precautions use walker/cane & assistance as needed  Disposition Home     Diet: Heart Healthy    Special Instructions: If you have smoked or chewed Tobacco  in the last 2 yrs please stop smoking, stop any regular Alcohol  and or any Recreational drug  use.  On your next visit with your primary care physician please Get Medicines reviewed and adjusted.  Please request your Prim.MD to go over all Hospital Tests and Procedure/Radiological results at the follow up, please get all Hospital records sent to your Prim MD by signing hospital release before you go home.  If you experience worsening of your admission symptoms, develop shortness of breath, life threatening emergency, suicidal or homicidal thoughts you must seek medical attention immediately by calling 911 or calling your MD immediately  if symptoms less severe.  You Must read complete instructions/literature along with all the possible adverse reactions/side effects for all the Medicines you take and that have been prescribed to you. Take any new Medicines after you have completely understood and accpet all the possible adverse reactions/side effects.   Do not drive when taking Pain medications.  Do not take more than prescribed Pain, Sleep and Anxiety Medications  Wear Seat belts while driving.   Increase activity slowly   Complete by: As directed        Discharge Medications   Allergies as of 10/25/2024       Reactions   Alprazolam Hives   Alprazolam Hives   Crestor [rosuvastatin Calcium] Other (See Comments)   Joint pain        Medication List     STOP taking these medications    atenolol -chlorthalidone  50-25 MG tablet Commonly known as: TENORETIC    baclofen  10 MG tablet Commonly known as: LIORESAL    meloxicam  7.5 MG tablet Commonly known as: MOBIC    pravastatin  20 MG tablet Commonly known as: PRAVACHOL        TAKE these medications    aspirin EC 81 MG tablet Take 81 mg by mouth daily. Swallow whole.   atenolol  50 MG tablet Commonly known as: Tenormin  Take 1 tablet (50 mg total) by mouth daily.   atorvastatin 40 MG tablet Commonly known as: LIPITOR Take 1 tablet (40 mg total) by mouth daily.   clopidogrel 75 MG tablet Commonly known as:  PLAVIX Take 1 tablet (75 mg total) by mouth daily.   folic acid 1 MG tablet Commonly known as: FOLVITE Take 1 tablet (1 mg total) by mouth daily.   lidocaine  4 % Place 1 patch onto the skin daily.   omeprazole  40 MG capsule Commonly known as:  PRILOSEC TAKE 1 CAPSULE (40 MG TOTAL) BY MOUTH DAILY.   potassium chloride  SA 20 MEQ tablet Commonly known as: KLOR-CON  M Take 1 tablet (20 mEq total) by mouth daily for 2 doses. Take for 2 days then get your potassium rechecked by your family physician in 3 to 4 days and resume if instructed by them. What changed:  when to take this additional instructions   thiamine 100 MG tablet Commonly known as: VITAMIN B1 Take 1 tablet (100 mg total) by mouth daily.   traZODone  150 MG tablet Commonly known as: DESYREL  TAKE 1 TABLET BY MOUTH AT BEDTIME.         Follow-up Information     Tanda Bleacher, MD. Schedule an appointment as soon as possible for a visit in 3 day(s).   Specialty: Family Medicine Contact information: [address] [place] [address] [phone]         [facility]. Schedule an appointment as soon as possible for a visit in 1 month(s).   Contact information: [address]     [address] [place]  [address] [phone]                Major procedures and Radiology Reports - PLEASE review detailed and final reports thoroughly  -       ECHOCARDIOGRAM COMPLETE Result Date: 10/24/2024    ECHOCARDIOGRAM REPORT   Patient Name:   HAN LYSNE Date of Exam: 10/24/2024 Medical Rec #:  [id number]      Height:       65.0 in Accession #:    [id number]     Weight:       147.8 lb Date of Birth:  1960/12/25       BSA:          1.740 m Patient Age:    63 years       BP:           123/78 mmHg Patient Gender: F              HR:           71 bpm. Exam Location:  Inpatient Procedure: 2D Echo, 3D Echo, Cardiac Doppler, Color Doppler and Strain Analysis            (Both  Spectral and Color Flow Doppler were utilized during            procedure). Indications:    TIA  History:        Patient has no prior history of Echocardiogram examinations.                 Risk Factors:Hypertension.  Sonographer:    Philomena Daring Referring Phys: [id number] CAROLE N HALL  Sonographer Comments: Global longitudinal strain was attempted. IMPRESSIONS  1. Left ventricular ejection fraction, by estimation, is 60 to 65%. Left ventricular ejection fraction by 3D volume is 63 %. The left ventricle has normal function. The left ventricle has no regional wall motion abnormalities. Left ventricular diastolic  parameters were normal. The average left ventricular global longitudinal strain is -16.7 %. The global longitudinal strain is normal.  2. Right ventricular systolic function is normal. The right ventricular size is normal. There is normal pulmonary artery systolic pressure. The estimated right ventricular systolic pressure is 22.2 mmHg.  3. The mitral valve is normal in structure. No evidence of mitral valve regurgitation. No evidence of mitral stenosis.  4. The aortic valve is normal in structure. Aortic valve regurgitation  is not visualized. No aortic stenosis is present.  5. The inferior vena cava is normal in size with greater than 50% respiratory variability, suggesting right atrial pressure of 3 mmHg. Conclusion(s)/Recommendation(s): No intracardiac source of embolism detected on this transthoracic study. Consider a transesophageal echocardiogram to exclude cardiac source of embolism if clinically indicated. FINDINGS  Left Ventricle: Left ventricular ejection fraction, by estimation, is 60 to 65%. Left ventricular ejection fraction by 3D volume is 63 %. The left ventricle has normal function. The left ventricle has no regional wall motion abnormalities. The average left ventricular global longitudinal strain is -16.7 %. Strain was performed and the global longitudinal strain is normal. The left  ventricular internal cavity size was normal in size. There is no left ventricular hypertrophy. Left ventricular diastolic parameters were normal. Right Ventricle: The right ventricular size is normal. No increase in right ventricular wall thickness. Right ventricular systolic function is normal. There is normal pulmonary artery systolic pressure. The tricuspid regurgitant velocity is 2.19 m/s, and  with an assumed right atrial pressure of 3 mmHg, the estimated right ventricular systolic pressure is 22.2 mmHg. Left Atrium: Left atrial size was normal in size. Right Atrium: Right atrial size was normal in size. Pericardium: There is no evidence of pericardial effusion. Mitral Valve: The mitral valve is normal in structure. No evidence of mitral valve regurgitation. No evidence of mitral valve stenosis. Tricuspid Valve: The tricuspid valve is normal in structure. Tricuspid valve regurgitation is trivial. No evidence of tricuspid stenosis. Aortic Valve: The aortic valve is normal in structure. Aortic valve regurgitation is not visualized. No aortic stenosis is present. Pulmonic Valve: The pulmonic valve was normal in structure. Pulmonic valve regurgitation is not visualized. No evidence of pulmonic stenosis. Aorta: The aortic root is normal in size and structure. Venous: The inferior vena cava is normal in size with greater than 50% respiratory variability, suggesting right atrial pressure of 3 mmHg. IAS/Shunts: No atrial level shunt detected by color flow Doppler. Additional Comments: 3D was performed not requiring image post processing on an independent workstation and was normal.  LEFT VENTRICLE PLAX 2D LVIDd:         4.60 cm         Diastology LVIDs:         2.90 cm         LV e' medial:    8.27 cm/s LV PW:         0.80 cm         LV E/e' medial:  9.0 LV IVS:        0.80 cm         LV e' lateral:   10.40 cm/s LVOT diam:     2.00 cm         LV E/e' lateral: 7.2 LV SV:         56 LV SV Index:   32              2D  Longitudinal LVOT Area:     3.14 cm        Strain LV IVRT:       63 msec         2D Strain GLS   -16.7 %                                Avg:  3D Volume EF                                LV 3D EF:    Left                                             ventricul                                             ar                                             ejection                                             fraction                                             by 3D                                             volume is                                             63 %.                                 3D Volume EF:                                3D EF:        63 %                                LV EDV:       91 ml                                LV ESV:       34 ml                                LV SV:        57 ml RIGHT VENTRICLE             IVC RV Basal diam:  2.60 cm     IVC diam: 2.20 cm RV Mid diam:    2.00 cm RV S prime:  15.30 cm/s  PULMONARY VEINS TAPSE (M-mode): 1.9 cm      Diastolic Velocity: 56.30 cm/s                             S/D Velocity:       1.00                             Systolic Velocity:  56.30 cm/s LEFT ATRIUM             Index        RIGHT ATRIUM          Index LA diam:        3.20 cm 1.84 cm/m   RA Area:     9.63 cm LA Vol (A2C):   31.9 ml 18.34 ml/m  RA Volume:   18.00 ml 10.35 ml/m LA Vol (A4C):   26.3 ml 15.12 ml/m LA Biplane Vol: 30.4 ml 17.48 ml/m  AORTIC VALVE LVOT Vmax:   88.60 cm/s LVOT Vmean:  56.100 cm/s LVOT VTI:    0.179 m  AORTA Ao Root diam: 3.00 cm Ao Asc diam:  3.50 cm MITRAL VALVE               TRICUSPID VALVE MV Area (PHT): 4.89 cm    TR Peak grad:   19.2 mmHg MV Decel Time: 155 msec    TR Vmax:        219.00 cm/s MV E velocity: 74.40 cm/s MV A velocity: 68.70 cm/s  SHUNTS MV E/A ratio:  1.08        Systemic VTI:  0.18 m                            Systemic Diam: 2.00 cm Oneil Parchment MD Electronically signed by Oneil Parchment MD Signature  Date/Time: 10/24/2024/12:53:03 PM    Final    MR BRAIN WO CONTRAST Result Date: 10/23/2024 EXAM: MRI BRAIN WITHOUT CONTRAST 10/23/2024 05:48:24 PM TECHNIQUE: Multiplanar multisequence MRI of the head/brain was performed without the administration of intravenous contrast. COMPARISON: 07/31/2023 CLINICAL HISTORY: Transient ischemic attack (TIA). FINDINGS: BRAIN AND VENTRICLES: No acute infarct. No intracranial hemorrhage. No mass. No midline shift. No hydrocephalus. The sella is unremarkable. Normal flow voids. ORBITS: No acute abnormality. SINUSES AND MASTOIDS: No acute abnormality. BONES AND SOFT TISSUES: Normal marrow signal. No acute soft tissue abnormality. IMPRESSION: 1. No acute intracranial abnormality. Electronically signed by: Franky Stanford MD 10/23/2024 06:06 PM EDT RP Workstation: [id number]   CT ANGIO HEAD NECK W WO CM (CODE STROKE) Result Date: 10/23/2024 EXAM: CTA Head and Neck with Intravenous Contrast. CT Head without Contrast. CLINICAL HISTORY: Neuro deficit, acute, stroke suspected. TECHNIQUE: Axial CTA images of the head and neck performed with intravenous contrast. MIP reconstructed images were created and reviewed. Axial computed tomography images of the head/brain performed without intravenous contrast. Note: Per PQRS, the description of internal carotid artery percent stenosis, including 0 percent or normal exam, is based on [place] American Symptomatic Carotid Endarterectomy Trial (NASCET) criteria. Dose reduction technique was used including one or more of the following: automated exposure control, adjustment of mA and kV according to patient size, and/or iterative reconstruction. CONTRAST: 75mL iohexol  (OMNIPAQUE ) 350 MG/ML injection 75 mL IOHEXOL  350 MG/ML SOLN. COMPARISON: None provided. FINDINGS: BRAIN: No acute intraparenchymal hemorrhage. No mass lesion. No CT evidence for acute territorial infarct.  No midline shift or extra-axial collection. VENTRICLES: No hydrocephalus. ORBITS: The  orbits are unremarkable. SINUSES AND MASTOIDS: The paranasal sinuses and mastoid air cells are clear. COMMON CAROTID ARTERIES: No significant stenosis. No dissection or occlusion. INTERNAL CAROTID ARTERIES: No stenosis by NASCET criteria. No dissection or occlusion. VERTEBRAL ARTERIES: No significant stenosis. No dissection or occlusion. ANTERIOR CEREBRAL ARTERIES: No significant stenosis. No occlusion. No aneurysm. MIDDLE CEREBRAL ARTERIES: No significant stenosis. No occlusion. No aneurysm. POSTERIOR CEREBRAL ARTERIES: No significant stenosis. No occlusion. No aneurysm. BASILAR ARTERY: No significant stenosis. No occlusion. No aneurysm. SOFT TISSUES: No acute finding. No masses or lymphadenopathy. BONES: No acute osseous abnormality. IMPRESSION: 1. No acute intracranial hemorrhage or ischemic change. 2. No evidence of significant stenosis, aneurysmal dilatation, or dissection involving the arteries of the head and neck. Electronically signed by: Franky Stanford MD 10/23/2024 03:49 PM EDT RP Workstation: [id number]   CT HEAD CODE STROKE WO CONTRAST Result Date: 10/23/2024 EXAM: CT HEAD WITHOUT CONTRAST 10/23/2024 03:11:29 PM TECHNIQUE: CT of the head was performed without the administration of intravenous contrast. Automated exposure control, iterative reconstruction, and/or weight based adjustment of the mA/kV was utilized to reduce the radiation dose to as low as reasonably achievable. COMPARISON: Comparison 08/15/2024. CLINICAL HISTORY: Neuro deficit, acute, stroke suspected. FINDINGS: BRAIN AND VENTRICLES: No acute hemorrhage. No evidence of acute infarct. No hydrocephalus. No extra-axial collection. No mass effect or midline shift. [place] Stroke Program Early CT Score (ASPECTS) Ganglionic (caudate, ic, lentiform nucleus, insula, M1-m3): 7 Supraganglionic (m4-m6): 3 Total: 10 ORBITS: No acute abnormality. SINUSES: No acute abnormality. SOFT TISSUES AND SKULL: No acute soft tissue abnormality. No skull  fracture. IMPRESSION: 1. No acute intracranial abnormality. 2. ASPECTS: 10. Findings discussed with Dr. Lynwood Moulder at 3:17 pm on 11/03/24 Electronically signed by: Franky Stanford MD 10/23/2024 03:18 PM EDT RP Workstation: [id number]    Micro Results    No results found for this or any previous visit (from the past 240 hours).  Today   Subjective    Faatima Niedermeier today has no headache,no chest abdominal pain,no new weakness tingling or numbness, feels much better wants to go home today.     Objective   Blood pressure 120/83, pulse 77, temperature 97.6 F (36.4 C), temperature source Oral, resp. rate 11, SpO2 98%.   Intake/Output Summary (Last 24 hours) at 10/25/2024 0717 Last data filed at 10/24/2024 1200 Gross per 24 hour  Intake 360 ml  Output --  Net 360 ml    Exam  Awake Alert, No new F.N deficits,    Hughes.AT,PERRAL Supple Neck,   Symmetrical Chest wall movement, Good air movement bilaterally, CTAB RRR,No Gallops,   +ve B.Sounds, Abd Soft, Non tender,  No Cyanosis, Clubbing or edema    Data Review   Recent Labs  Lab 10/23/24 1524 10/24/24 0344  WBC 5.8 6.9  HGB 16.0* 13.9  HCT 45.2 39.5  PLT 409* 337  MCV 90.9 91.2  MCH 32.2 32.1  MCHC 35.4 35.2  RDW 13.4 13.4  LYMPHSABS 2.4  --   MONOABS 0.3  --   EOSABS 0.1  --   BASOSABS 0.1  --     Recent Labs  Lab 10/23/24 1623 10/24/24 0344 10/25/24 0322  NA 140 140 138  K 3.4* 3.0* 3.4*  CL 104 104 104  CO2 25 24 23   ANIONGAP 12 12 11   GLUCOSE 97 94 107*  BUN 11 9 5*  CREATININE 1.08* 1.20* 1.08*  AST 23  --   --  ALT 12  --   --   ALKPHOS 41  --   --   BILITOT <0.2  --   --   ALBUMIN 3.6  --   --   HGBA1C  --  5.1  --   MG  --  1.6* 1.9  PHOS  --  3.9  --   CALCIUM 8.7* 8.5* 8.3*   Lab Results  Component Value Date   CHOL 151 10/24/2024   HDL 58 10/24/2024   LDLCALC 66 10/24/2024   TRIG 134 10/24/2024   CHOLHDL 2.6 10/24/2024    Lab Results  Component Value Date   HGBA1C 5.1  10/24/2024    Total Time in preparing paper work, data evaluation and todays exam - 35 minutes  Signature  -    Lavada Stank M.D on 10/25/2024 at 7:17 AM   -  To page go to [email or web address]

## 2024-10-25 NOTE — Plan of Care (Signed)
   Problem: Ischemic Stroke/TIA Tissue Perfusion: Goal: Complications of ischemic stroke/TIA will be minimized Outcome: Progressing

## 2024-10-26 ENCOUNTER — Telehealth: Payer: Self-pay

## 2024-10-26 NOTE — Transitions of Care (Post Inpatient/ED Visit) (Signed)
   10/26/2024  Name: Teresa Cabrera MRN: [id number] DOB: 04/19/1961  Today's TOC FU Call Status: Today's TOC FU Call Status:: Successful TOC FU Call Completed TOC FU Call Complete Date: 10/26/24 Patient's Name and Date of Birth confirmed.  Transition Care Management Follow-up Telephone Call Date of Discharge: 10/25/24 Discharge Facility: [facility]) Type of Discharge: Inpatient Admission Primary Inpatient Discharge Diagnosis:: TIA How have you been since you were released from the hospital?: Better Any questions or concerns?: No  Items Reviewed: Did you receive and understand the discharge instructions provided?: Yes Medications obtained,verified, and reconciled?: Yes (Medications Reviewed) Any new allergies since your discharge?: No Dietary orders reviewed?: Yes Do you have support at home?: Yes People in Home [RPT]: spouse  Medications Reviewed Today: Medications Reviewed Today     Reviewed by Emmitt Pan, LPN (Licensed Practical Nurse) on 10/26/24 at 1050  Med List Status: <None>   Medication Order Taking? Sig Documenting Provider Last Dose Status Informant  aspirin EC 81 MG tablet [id number]  Take 81 mg by mouth daily. Swallow whole.  Patient not taking: Reported on 10/26/2024   [provider]  Active   atenolol  (TENORMIN ) 50 MG tablet [id number] Yes Take 1 tablet (50 mg total) by mouth daily. Singh, Prashant K, MD  Active   atorvastatin (LIPITOR) 40 MG tablet [id number] Yes Take 1 tablet (40 mg total) by mouth daily. Tanda Bleacher, MD  Active   clopidogrel (PLAVIX) 75 MG tablet [id number] Yes Take 1 tablet (75 mg total) by mouth daily. Singh, Prashant K, MD  Active   folic acid (FOLVITE) 1 MG tablet [id number] Yes Take 1 tablet (1 mg total) by mouth daily. Singh, Prashant K, MD  Active   lidocaine  4 % [id number] Yes Place 1 patch onto the skin daily. Johnie Flaming A, NP  Active   omeprazole  (PRILOSEC) 40 MG capsule [id number] Yes TAKE 1 CAPSULE (40 MG TOTAL)  BY MOUTH DAILY. Tanda Bleacher, MD  Active   potassium chloride  SA (KLOR-CON  M) 20 MEQ tablet [id number] Yes Take 1 tablet (20 mEq total) by mouth daily for 2 doses. Take for 2 days then get your potassium rechecked by your family physician in 3 to 4 days and resume if instructed by them. Singh, Prashant K, MD  Active   thiamine (VITAMIN B1) 100 MG tablet [id number] Yes Take 1 tablet (100 mg total) by mouth daily. Singh, Prashant K, MD  Active   traZODone  (DESYREL ) 150 MG tablet [id number] Yes TAKE 1 TABLET BY MOUTH AT BEDTIME. Lorren Greig PARAS, NP  Active             Home Care and Equipment/Supplies: Were Home Health Services Ordered?: NA Any new equipment or medical supplies ordered?: NA  Functional Questionnaire: Do you need assistance with bathing/showering or dressing?: No Do you need assistance with meal preparation?: No Do you need assistance with eating?: No Do you have difficulty maintaining continence: No Do you need assistance with getting out of bed/getting out of a chair/moving?: No Do you have difficulty managing or taking your medications?: No  Follow up appointments reviewed: PCP Follow-up appointment confirmed?: No (no avail appts, sent message to staff to schedule) MD Provider Line Number:[phone] Given: No Specialist Hospital Follow-up appointment confirmed?: NA Do you need transportation to your follow-up appointment?: No Do you understand care options if your condition(s) worsen?: Yes-patient verbalized understanding    SIGNATURE Pan Emmitt, LPN [facility] Nurse Health Advisor Direct Dial [phone]

## 2024-10-28 LAB — VITAMIN B1: Vitamin B1 (Thiamine): 193.4 nmol/L (ref 66.5–200.0)

## 2024-11-01 ENCOUNTER — Ambulatory Visit: Payer: Self-pay | Admitting: Family Medicine

## 2024-11-02 NOTE — Telephone Encounter (Signed)
No available appts. Please advise.

## 2024-11-02 NOTE — Telephone Encounter (Signed)
 Pt scheduled. Called pt, could not reach or leave vm.

## 2024-11-03 ENCOUNTER — Inpatient Hospital Stay: Admitting: Family Medicine

## 2024-11-03 NOTE — Telephone Encounter (Signed)
 Copied from CRM [id number]. Topic: Clinical - Lab/Test Results >> Nov 02, 2024  4:01 PM Amy B wrote: Reason for CRM: Relayed lab result to patient.

## 2024-11-10 ENCOUNTER — Ambulatory Visit (INDEPENDENT_AMBULATORY_CARE_PROVIDER_SITE_OTHER): Admitting: Family Medicine

## 2024-11-10 ENCOUNTER — Encounter: Payer: Self-pay | Admitting: Family Medicine

## 2024-11-10 ENCOUNTER — Other Ambulatory Visit (HOSPITAL_COMMUNITY): Payer: Self-pay

## 2024-11-10 ENCOUNTER — Telehealth: Payer: Self-pay | Admitting: Family Medicine

## 2024-11-10 VITALS — BP 182/91 | HR 61 | Ht 65.0 in | Wt 156.8 lb

## 2024-11-10 DIAGNOSIS — E782 Mixed hyperlipidemia: Secondary | ICD-10-CM

## 2024-11-10 DIAGNOSIS — G4700 Insomnia, unspecified: Secondary | ICD-10-CM

## 2024-11-10 DIAGNOSIS — F17200 Nicotine dependence, unspecified, uncomplicated: Secondary | ICD-10-CM

## 2024-11-10 DIAGNOSIS — Z8673 Personal history of transient ischemic attack (TIA), and cerebral infarction without residual deficits: Secondary | ICD-10-CM

## 2024-11-10 DIAGNOSIS — I10 Essential (primary) hypertension: Secondary | ICD-10-CM | POA: Diagnosis not present

## 2024-11-10 DIAGNOSIS — Z1231 Encounter for screening mammogram for malignant neoplasm of breast: Secondary | ICD-10-CM

## 2024-11-10 MED ORDER — TRAZODONE HCL 100 MG PO TABS
100.0000 mg | ORAL_TABLET | Freq: Every day | ORAL | 0 refills | Status: AC
Start: 2024-11-10 — End: ?

## 2024-11-10 NOTE — Progress Notes (Signed)
 Established Patient Office Visit  Subjective    Patient ID: Teresa Cabrera, female    DOB: 28-Dec-1960  Age: 63 y.o. MRN: [id number]  CC:  Chief Complaint  Patient presents with   Medical Management of Chronic Issues    Pt reports she was in hospital 10/23/24 -10/25/24     HPI Teresa Cabrera presents for follow up of hypertension. She also reports that she continues to have insomnia but the the meds are helpful. Patient denies acute complaints.   Outpatient Encounter Medications as of 11/10/2024  Medication Sig   atenolol  (TENORMIN ) 50 MG tablet Take 1 tablet (50 mg total) by mouth daily.   clopidogrel  (PLAVIX ) 75 MG tablet Take 1 tablet (75 mg total) by mouth daily.   folic acid  (FOLVITE ) 1 MG tablet Take 1 tablet (1 mg total) by mouth daily.   omeprazole  (PRILOSEC) 40 MG capsule TAKE 1 CAPSULE (40 MG TOTAL) BY MOUTH DAILY.   thiamine  (VITAMIN B1) 100 MG tablet Take 1 tablet (100 mg total) by mouth daily.   traZODone  (DESYREL ) 100 MG tablet Take 1 tablet (100 mg total) by mouth at bedtime.   aspirin  EC 81 MG tablet Take 81 mg by mouth daily. Swallow whole. (Patient not taking: Reported on 10/26/2024)   atorvastatin  (LIPITOR) 40 MG tablet Take 1 tablet (40 mg total) by mouth daily.   lidocaine  4 % Place 1 patch onto the skin daily.   potassium chloride  SA (KLOR-CON  M) 20 MEQ tablet Take 1 tablet (20 mEq total) by mouth daily for 2 doses. Take for 2 days then get your potassium rechecked by your family physician in 3 to 4 days and resume if instructed by them. (Patient not taking: Reported on 11/10/2024)   traZODone  (DESYREL ) 150 MG tablet TAKE 1 TABLET BY MOUTH AT BEDTIME.   No facility-administered encounter medications on file as of 11/10/2024.    Past Medical History:  Diagnosis Date   Anemia    Anxiety    Depression    GERD (gastroesophageal reflux disease)    Hyperlipidemia    Hypertension    Insomnia    Substance abuse (HCC)     Past Surgical History:  Procedure  Laterality Date   COLONOSCOPY  2012   TUBAL LIGATION      Family History  Problem Relation Age of Onset   Other Mother        lou gehrigs disease   Breast cancer Sister    Aneurysm Sister    Cancer Paternal Grandmother    Colon cancer Neg Hx    Colon polyps Neg Hx    Crohn's disease Neg Hx    Esophageal cancer Neg Hx    Rectal cancer Neg Hx    Stomach cancer Neg Hx     Social History   Socioeconomic History   Marital status: Married    Spouse name: Not on file   Number of children: Not on file   Years of education: Not on file   Highest education level: Not on file  Occupational History   Not on file  Tobacco Use   Smoking status: Some Days    Current packs/day: 0.25    Types: Cigarettes    Passive exposure: Current   Smokeless tobacco: Never   Tobacco comments:    No smoking today 07/01/22  Vaping Use   Vaping status: Never Used  Substance and Sexual Activity   Alcohol use: Yes    Alcohol/week: 16.0 standard drinks of alcohol  Types: 16 Standard drinks or equivalent per week    Comment: 1/2 pt. daily   Drug use: Not Currently    Types: Marijuana   Sexual activity: Not on file  Other Topics Concern   Not on file  Social History Narrative   Not on file   Social Drivers of Health   Financial Resource Strain: Low Risk  (06/28/2024)   Overall Financial Resource Strain (CARDIA)    Difficulty of Paying Living Expenses: Not hard at all  Food Insecurity: No Food Insecurity (10/23/2024)   Hunger Vital Sign    Worried About Running Out of Food in the Last Year: Never true    Ran Out of Food in the Last Year: Never true  Transportation Needs: No Transportation Needs (10/23/2024)   PRAPARE - Administrator, Civil Service (Medical): No    Lack of Transportation (Non-Medical): No  Physical Activity: Inactive (06/28/2024)   Exercise Vital Sign    Days of Exercise per Week: 0 days    Minutes of Exercise per Session: 0 min  Stress: No Stress Concern Present  (06/28/2024)   [employer] of Occupational Health - Occupational Stress Questionnaire    Feeling of Stress: Only a little  Social Connections: Moderately Integrated (06/28/2024)   Social Connection and Isolation Panel    Frequency of Communication with Friends and Family: More than three times a week    Frequency of Social Gatherings with Friends and Family: More than three times a week    Attends Religious Services: More than 4 times per year    Active Member of [employer] or Organizations: No    Attends Banker Meetings: Never    Marital Status: Married  Catering Manager Violence: Not At Risk (10/23/2024)   Humiliation, Afraid, Rape, and Kick questionnaire    Fear of Current or Ex-Partner: No    Emotionally Abused: No    Physically Abused: No    Sexually Abused: No    Review of Systems  All other systems reviewed and are negative.       Objective    BP (!) 182/91   Pulse 61   Ht 5' 5 (1.651 m)   Wt 156 lb 12.8 oz (71.1 kg)   SpO2 97%   BMI 26.09 kg/m   Physical Exam Vitals and nursing note reviewed.  Constitutional:      General: She is not in acute distress. Cardiovascular:     Rate and Rhythm: Normal rate and regular rhythm.  Pulmonary:     Effort: Pulmonary effort is normal.     Breath sounds: Normal breath sounds.  Abdominal:     Palpations: Abdomen is soft.     Tenderness: There is no abdominal tenderness.  Neurological:     General: No focal deficit present.     Mental Status: She is alert and oriented to person, place, and time.     Motor: No weakness.     Gait: Gait normal.  Psychiatric:        Mood and Affect: Mood normal.        Behavior: Behavior normal.         Assessment & Plan:   1. Essential hypertension (Primary) Elevated readings. Discussed compliance.   2. History of TIA (transient ischemic attack)   3. Insomnia, unspecified type Trazodone  refilled   4. Mixed hyperlipidemia Continue   5. Encounter for screening  mammogram for malignant neoplasm of breast  - MM 3D SCREENING MAMMOGRAM BILATERAL BREAST; Future  6. Current smoker Discussed reduction/cessation  No follow-ups on file.   Tanda Raguel SQUIBB, MD

## 2024-11-10 NOTE — Telephone Encounter (Signed)
 Pt forgot to mention during OV that she wants a mammogram. Please advise

## 2024-11-11 ENCOUNTER — Other Ambulatory Visit (HOSPITAL_COMMUNITY): Payer: Self-pay

## 2024-11-12 ENCOUNTER — Encounter: Payer: Self-pay | Admitting: Family Medicine

## 2024-11-12 MED ORDER — FOLIC ACID 1 MG PO TABS: 1.0000 mg | ORAL_TABLET | Freq: Every day | ORAL | 11 refills | Status: AC

## 2024-11-12 MED ORDER — CLOPIDOGREL BISULFATE 75 MG PO TABS: 75.0000 mg | ORAL_TABLET | Freq: Every day | ORAL | 1 refills | Status: AC

## 2024-11-12 MED ORDER — THIAMINE HCL 100 MG PO TABS: 100.0000 mg | ORAL_TABLET | Freq: Every day | ORAL | 11 refills | Status: AC

## 2024-11-19 ENCOUNTER — Emergency Department (HOSPITAL_COMMUNITY)

## 2024-11-19 ENCOUNTER — Emergency Department (HOSPITAL_COMMUNITY)
Admission: EM | Admit: 2024-11-19 | Discharge: 2024-11-19 | Disposition: A | Discharge status: Home / Self Care | Attending: Emergency Medicine | Admitting: Emergency Medicine

## 2024-11-19 ENCOUNTER — Other Ambulatory Visit: Payer: Self-pay

## 2024-11-19 DIAGNOSIS — R202 Paresthesia of skin: Secondary | ICD-10-CM | POA: Insufficient documentation

## 2024-11-19 DIAGNOSIS — R200 Anesthesia of skin: Secondary | ICD-10-CM | POA: Insufficient documentation

## 2024-11-19 DIAGNOSIS — I10 Essential (primary) hypertension: Secondary | ICD-10-CM | POA: Insufficient documentation

## 2024-11-19 DIAGNOSIS — Z7902 Long term (current) use of antithrombotics/antiplatelets: Secondary | ICD-10-CM | POA: Insufficient documentation

## 2024-11-19 DIAGNOSIS — M79601 Pain in right arm: Secondary | ICD-10-CM | POA: Diagnosis not present

## 2024-11-19 LAB — CBC WITH DIFFERENTIAL/PLATELET
Abs Immature Granulocytes: 0.01 K/uL (ref 0.00–0.07)
Basophils Absolute: 0 K/uL (ref 0.0–0.1)
Basophils Relative: 1 %
Eosinophils Absolute: 0.2 K/uL (ref 0.0–0.5)
Eosinophils Relative: 5 %
HCT: 42.9 % (ref 36.0–46.0)
Hemoglobin: 14.4 g/dL (ref 12.0–15.0)
Immature Granulocytes: 0 %
Lymphocytes Relative: 40 %
Lymphs Abs: 1.9 K/uL (ref 0.7–4.0)
MCH: 31.7 pg (ref 26.0–34.0)
MCHC: 33.6 g/dL (ref 30.0–36.0)
MCV: 94.5 fL (ref 80.0–100.0)
Monocytes Absolute: 0.4 K/uL (ref 0.1–1.0)
Monocytes Relative: 8 %
Neutro Abs: 2.2 K/uL (ref 1.7–7.7)
Neutrophils Relative %: 46 %
Platelets: 338 K/uL (ref 150–400)
RBC: 4.54 MIL/uL (ref 3.87–5.11)
RDW: 13 % (ref 11.5–15.5)
WBC: 4.8 K/uL (ref 4.0–10.5)
nRBC: 0 % (ref 0.0–0.2)

## 2024-11-19 LAB — COMPREHENSIVE METABOLIC PANEL WITH GFR
ALT: 20 U/L (ref 0–44)
AST: 25 U/L (ref 15–41)
Albumin: 3.8 g/dL (ref 3.5–5.0)
Alkaline Phosphatase: 43 U/L (ref 38–126)
Anion gap: 10 (ref 5–15)
BUN: 11 mg/dL (ref 8–23)
CO2: 24 mmol/L (ref 22–32)
Calcium: 9.6 mg/dL (ref 8.9–10.3)
Chloride: 103 mmol/L (ref 98–111)
Creatinine, Ser: 1.14 mg/dL — ABNORMAL HIGH (ref 0.44–1.00)
GFR, Estimated: 54 mL/min — ABNORMAL LOW (ref >60)
Glucose, Bld: 105 mg/dL — ABNORMAL HIGH (ref 70–99)
Potassium: 3.8 mmol/L (ref 3.5–5.1)
Sodium: 137 mmol/L (ref 135–145)
Total Bilirubin: 0.5 mg/dL (ref 0.0–1.2)
Total Protein: 6.8 g/dL (ref 6.5–8.1)

## 2024-11-19 LAB — TROPONIN I (HIGH SENSITIVITY): Troponin I (High Sensitivity): 4 ng/L (ref <18)

## 2024-11-19 LAB — MAGNESIUM: Magnesium: 1.8 mg/dL (ref 1.7–2.4)

## 2024-11-19 NOTE — ED Provider Notes (Signed)
 [place] EMERGENCY DEPARTMENT AT [facility] Provider Note   CSN: [id number] Arrival date & time: 11/19/24  9060     Patient presents with: Numbness   Teresa Cabrera is a 63 y.o. female.   Patient is a 63 year old female with a past medical history of hypertension, TIA, alcohol use disorder presenting to the emergency department with numbness.  Patient states that she has had numbness in her left face and her left leg that has been coming and going for months.  She states that she was admitted to the hospital earlier this month with a TIA workup.  She states that they changed her blood pressure medication while being admitted and since then her blood pressure has been significantly elevated.  She states that she stopped taking the new blood pressure medication and went back to her old blood pressure medication 1 week ago.  She states that since being discharged her left-sided numbness has still been coming and going.  She states that last night she had numbness in her right upper arm for the first time.  She states that it felt like a tightness around her arm.  She states that it eventually went away on its own after about 20 minutes and she went to bed.  She states that when she woke up this morning the tightness returned.  She denies any radiation of pain into her chest or associated shortness of breath.  She denies any associated weakness.  She denies any headaches or neck pain.  The history is provided by the patient.       Prior to Admission medications   Medication Sig Start Date End Date Taking? Authorizing Provider  aspirin  EC 81 MG tablet Take 81 mg by mouth daily. Swallow whole. Patient not taking: Reported on 10/26/2024    [provider]  atenolol  (TENORMIN ) 50 MG tablet Take 1 tablet (50 mg total) by mouth daily. 10/25/24 10/25/25  Singh, Prashant K, MD  atorvastatin  (LIPITOR) 40 MG tablet Take 1 tablet (40 mg total) by mouth daily. 09/22/24   Tanda Bleacher,  MD  clopidogrel  (PLAVIX ) 75 MG tablet Take 1 tablet (75 mg total) by mouth daily. 11/12/24   Tanda Bleacher, MD  folic acid  (FOLVITE ) 1 MG tablet Take 1 tablet (1 mg total) by mouth daily. 11/12/24   Tanda Bleacher, MD  lidocaine  4 % Place 1 patch onto the skin daily. 05/29/24   Johnie Flaming A, NP  omeprazole  (PRILOSEC) 40 MG capsule TAKE 1 CAPSULE (40 MG TOTAL) BY MOUTH DAILY. 09/23/24   Tanda Bleacher, MD  potassium chloride  SA (KLOR-CON  M) 20 MEQ tablet Take 1 tablet (20 mEq total) by mouth daily for 2 doses. Take for 2 days then get your potassium rechecked by your family physician in 3 to 4 days and resume if instructed by them. Patient not taking: Reported on 11/10/2024 10/25/24 10/27/24  Singh, Prashant K, MD  thiamine  (VITAMIN B1) 100 MG tablet Take 1 tablet (100 mg total) by mouth daily. 11/12/24   Tanda Bleacher, MD  traZODone  (DESYREL ) 100 MG tablet Take 1 tablet (100 mg total) by mouth at bedtime. 11/10/24   Tanda Bleacher, MD  traZODone  (DESYREL ) 150 MG tablet TAKE 1 TABLET BY MOUTH AT BEDTIME. 08/01/23   Jaycee Greig PARAS, NP    Allergies: Alprazolam, Alprazolam, and Crestor [rosuvastatin calcium ]    Review of Systems  Updated Vital Signs BP 138/80   Pulse (!) 52   Temp 97.6 F (36.4 C) (Oral)   Resp ROLLEN)  22   Ht 5' 5 (1.651 m)   Wt 70.3 kg   LMP  (LMP Unknown)   SpO2 100%   BMI 25.79 kg/m   Physical Exam Vitals and nursing note reviewed.  Constitutional:      Appearance: Normal appearance.  HENT:     Head: Normocephalic and atraumatic.     Nose: Nose normal.     Mouth/Throat:     Mouth: Mucous membranes are moist.     Pharynx: Oropharynx is clear.  Eyes:     Extraocular Movements: Extraocular movements intact.     Conjunctiva/sclera: Conjunctivae normal.     Pupils: Pupils are equal, round, and reactive to light.  Cardiovascular:     Rate and Rhythm: Normal rate and regular rhythm.     Pulses: Normal pulses.     Heart sounds: Normal heart sounds.  Pulmonary:      Effort: Pulmonary effort is normal.     Breath sounds: Normal breath sounds.  Abdominal:     General: Abdomen is flat.     Palpations: Abdomen is soft.     Tenderness: There is no abdominal tenderness.  Musculoskeletal:        General: No swelling or tenderness. Normal range of motion.     Cervical back: Normal range of motion. No tenderness.  Skin:    General: Skin is warm and dry.  Neurological:     General: No focal deficit present.     Mental Status: She is alert and oriented to person, place, and time.     Cranial Nerves: No cranial nerve deficit.     Sensory: No sensory deficit.     Motor: No weakness.  Psychiatric:        Mood and Affect: Mood normal.        Behavior: Behavior normal.     (all labs ordered are listed, but only abnormal results are displayed) Labs Reviewed  COMPREHENSIVE METABOLIC PANEL WITH GFR - Abnormal; Notable for the following components:      Result Value   Glucose, Bld 105 (*)    Creatinine, Ser 1.14 (*)    GFR, Estimated 54 (*)    All other components within normal limits  CBC WITH DIFFERENTIAL/PLATELET  MAGNESIUM   TROPONIN I (HIGH SENSITIVITY)    EKG: EKG Interpretation Date/Time:  Friday November 19 2024 09:50:34 EST Ventricular Rate:  59 PR Interval:  145 QRS Duration:  100 QT Interval:  468 QTC Calculation: 464 R Axis:   46  Text Interpretation: Sinus rhythm RSR' in V1 or V2, right VCD or RVH No significant change since last tracing Confirmed by Ellouise Fine ([id number]) on 11/19/2024 10:21:01 AM  Radiology: CT Head Wo Contrast Result Date: 11/19/2024 EXAM: CT HEAD WITHOUT CONTRAST 11/19/2024 11:34:17 AM TECHNIQUE: CT of the head was performed without the administration of intravenous contrast. Automated exposure control, iterative reconstruction, and/or weight based adjustment of the mA/kV was utilized to reduce the radiation dose to as low as reasonably achievable. COMPARISON: CT head and MRI head 10/23/2024. CLINICAL HISTORY:  Neuro deficit, acute, stroke suspected. FINDINGS: BRAIN AND VENTRICLES: No acute hemorrhage. No evidence of acute infarct. No hydrocephalus. No extra-axial collection. No mass effect or midline shift. Mild atherosclerosis of the carotid siphons. ORBITS: No acute abnormality. SINUSES: No acute abnormality. SOFT TISSUES AND SKULL: No acute soft tissue abnormality. No skull fracture. IMPRESSION: 1. No acute intracranial abnormality. Electronically signed by: Donnice Mania MD 11/19/2024 11:56 AM EST RP Workstation: [id number]     Procedures  Medications Ordered in the ED - No data to display  Clinical Course as of 11/19/24 1307  Fri Nov 19, 2024  1225 Labs within normal range. Symptoms since last night so single troponin is sufficient. CTH without acute disease. Low suspicion for CVA or ACS with symptoms. Patient is stable for discharge home with outpatient follow up. [VK]    Clinical Course User Index [VK] Kingsley, Osvaldo Lamping K, DO                                 Medical Decision Making This patient presents to the ED with chief complaint(s) of R arm pain, numbness with pertinent past medical history of TIA, HTN, ETOH use disorder which further complicates the presenting complaint. The complaint involves an extensive differential diagnosis and also carries with it a high risk of complications and morbidity.    The differential diagnosis includes CVA unlikely as no current neurologic deficits, TIA, electrolyte derangement, atypical ACS, muscle strain or spasm, good pulses bilaterally making vascular injury unlikely  Additional history obtained: Additional history obtained from spouse Records reviewed previous admission documents  ED Course and Reassessment: On patient's arrival she is hemodynamically stable in no acute distress and is currently asymptomatic.  EKG on arrival showed normal sinus rhythm without acute ischemic changes.  The patient will have labs including electrolytes, troponin as  well as CT head to evaluate for possible etiology of her symptoms and she will be closely reassessed.  Independent labs interpretation:  The following labs were independently interpreted: at baseline/no acute abnormality  Independent visualization of imaging: - I independently visualized the following imaging with scope of interpretation limited to determining acute life threatening conditions related to emergency care: CTH, which revealed no acute disease  Consultation: - Consulted or discussed management/test interpretation w/ external professional: N/A  Consideration for admission or further workup: Patient has no emergent conditions requiring admission or further work-up at this time and is stable for discharge home with primary care follow-up  Social Determinants of health: N/A    Amount and/or Complexity of Data Reviewed Labs: ordered. Radiology: ordered.       Final diagnoses:  Paresthesia  Right arm pain    ED Discharge Orders     None          Ellouise Richerd POUR, DO 11/19/24 1307

## 2024-11-19 NOTE — ED Triage Notes (Addendum)
 Pt states she had a fall sometime last week.  She went to the bathroom and woke up on the floor.  She is prescribed Plavix  but states she thinks she had stopped taking it when she had the syncopal episode. She was not evaluated for this.   She states her medicines had been adjusted recently and her BP went from good to worse after that so she went back to taking a different BP med.   States that she took Atenolol , ASA, LIpitor, Potassium this AM.

## 2024-11-19 NOTE — ED Triage Notes (Signed)
 Patient arrives for eval of L jaw, R arm, and L leg numbness that she woke up with yesterday. When asked to clarify LKW, she states the numbness has always been there.

## 2024-11-19 NOTE — Discharge Instructions (Signed)
 You were seen in the emergency department for your discomfort in your right arm as well as ear numbness.  Your workup today showed no signs of a new stroke or heart attack and your electrolytes were normal.  It is unclear what exactly was causing your symptoms today but may be related to your fluctuating blood pressures recently.  You can follow-up with your primary doctor to have your symptoms rechecked.  You should return to the emergency department if you develop weakness in one-sided body compared to the other, severe chest pain, difficulty speaking or any other new or concerning symptoms.

## 2024-11-24 ENCOUNTER — Other Ambulatory Visit: Payer: Self-pay | Admitting: Family Medicine

## 2024-12-18 ENCOUNTER — Other Ambulatory Visit: Payer: Self-pay | Admitting: Family Medicine

## 2024-12-18 DIAGNOSIS — E876 Hypokalemia: Secondary | ICD-10-CM

## 2024-12-18 DIAGNOSIS — K219 Gastro-esophageal reflux disease without esophagitis: Secondary | ICD-10-CM

## 2024-12-20 ENCOUNTER — Other Ambulatory Visit: Payer: Self-pay | Admitting: Family Medicine

## 2024-12-20 MED ORDER — ATORVASTATIN CALCIUM 40 MG PO TABS: 40.0000 mg | ORAL_TABLET | Freq: Every day | ORAL | 0 refills | Status: AC

## 2024-12-20 MED ORDER — ATENOLOL-CHLORTHALIDONE 50-25 MG PO TABS: 1.0000 | ORAL_TABLET | Freq: Every day | ORAL | 0 refills | Status: AC

## 2024-12-20 NOTE — Telephone Encounter (Signed)
 Complete

## 2024-12-20 NOTE — Telephone Encounter (Signed)
 Copied from CRM [id number]. Topic: Clinical - Medication Refill >> Dec 20, 2024 11:59 AM Hadassah PARAS wrote: Medication: atenolol -chlorthalidone  (TENORETIC ) 50-25 MG tablet (new med was elevating blood pressure, would like to be put on prev med); atorvastatin  (LIPITOR) 40 MG tablet   Has the patient contacted their pharmacy? No (Agent: If no, request that the patient contact the pharmacy for the refill. If patient does not wish to contact the pharmacy document the reason why and proceed with request.) (Agent: If yes, when and what did the pharmacy advise?)  This is the patient's preferred pharmacy:  [id number] [place], [place] - [address] [place]  [address] Phone: [phone] Fax: [phone]   Is this the correct pharmacy for this prescription? Yes If no, delete pharmacy and type the correct one.   Has the prescription been filled recently? No  Is the patient out of the medication? Yes  Has the patient been seen for an appointment in the last year OR does the patient have an upcoming appointment? Yes  Can we respond through MyChart? Yes  Agent: Please be advised that Rx refills may take up to 3 business days. We ask that you follow-up with your pharmacy.

## 2025-01-05 ENCOUNTER — Ambulatory Visit

## 2025-01-05 ENCOUNTER — Inpatient Hospital Stay: Admission: RE | Admit: 2025-01-05 | Discharge: 2025-01-05 | Attending: Family Medicine | Admitting: Family Medicine

## 2025-01-05 DIAGNOSIS — Z1231 Encounter for screening mammogram for malignant neoplasm of breast: Secondary | ICD-10-CM

## 2025-01-06 ENCOUNTER — Ambulatory Visit: Admitting: Family Medicine

## 2025-01-07 ENCOUNTER — Ambulatory Visit: Payer: Self-pay | Admitting: Family Medicine

## 2025-02-23 ENCOUNTER — Ambulatory Visit: Admitting: Family Medicine
